# Patient Record
Sex: Female | Born: 1935 | Race: Black or African American | Hispanic: No | Smoking: Never smoker
Health system: Southern US, Community
[De-identification: ages and names within clinical notes are randomized; demographics above are authoritative.]

## PROBLEM LIST (undated history)

## (undated) DIAGNOSIS — Z87898 Personal history of other specified conditions: Secondary | ICD-10-CM

## (undated) DIAGNOSIS — E785 Hyperlipidemia, unspecified: Secondary | ICD-10-CM

## (undated) DIAGNOSIS — M1990 Unspecified osteoarthritis, unspecified site: Secondary | ICD-10-CM

## (undated) DIAGNOSIS — I10 Essential (primary) hypertension: Secondary | ICD-10-CM

## (undated) HISTORY — DX: Hyperlipidemia, unspecified: E78.5

## (undated) HISTORY — DX: Personal history of other specified conditions: Z87.898

## (undated) HISTORY — DX: Unspecified osteoarthritis, unspecified site: M19.90

## (undated) HISTORY — DX: Essential (primary) hypertension: I10

---

## 1998-06-03 ENCOUNTER — Encounter: Payer: Self-pay | Admitting: Internal Medicine

## 1998-06-03 ENCOUNTER — Ambulatory Visit (HOSPITAL_COMMUNITY): Admission: RE | Admit: 1998-06-03 | Discharge: 1998-06-03 | Payer: Self-pay | Admitting: Internal Medicine

## 1998-07-26 ENCOUNTER — Ambulatory Visit (HOSPITAL_COMMUNITY): Admission: RE | Admit: 1998-07-26 | Discharge: 1998-07-26 | Payer: Self-pay | Admitting: Internal Medicine

## 1998-07-26 ENCOUNTER — Encounter: Payer: Self-pay | Admitting: Internal Medicine

## 2003-12-16 ENCOUNTER — Ambulatory Visit: Payer: Self-pay | Admitting: Nurse Practitioner

## 2004-07-06 ENCOUNTER — Ambulatory Visit: Payer: Self-pay | Admitting: Nurse Practitioner

## 2004-09-11 ENCOUNTER — Ambulatory Visit: Payer: Self-pay | Admitting: Nurse Practitioner

## 2004-09-20 ENCOUNTER — Ambulatory Visit: Payer: Self-pay | Admitting: Nurse Practitioner

## 2005-02-12 ENCOUNTER — Ambulatory Visit: Payer: Self-pay | Admitting: Nurse Practitioner

## 2005-02-13 ENCOUNTER — Ambulatory Visit: Payer: Self-pay | Admitting: Nurse Practitioner

## 2005-09-27 ENCOUNTER — Ambulatory Visit: Payer: Self-pay | Admitting: Nurse Practitioner

## 2005-10-17 ENCOUNTER — Ambulatory Visit: Payer: Self-pay | Admitting: Nurse Practitioner

## 2007-02-27 HISTORY — PX: NM MYOVIEW LTD: HXRAD82

## 2010-04-27 HISTORY — PX: TRANSTHORACIC ECHOCARDIOGRAM: SHX275

## 2010-09-26 ENCOUNTER — Encounter: Payer: Self-pay | Admitting: Podiatrist

## 2012-04-25 ENCOUNTER — Encounter: Payer: Self-pay | Admitting: Cardiology

## 2012-04-25 DIAGNOSIS — E785 Hyperlipidemia, unspecified: Secondary | ICD-10-CM

## 2012-05-28 ENCOUNTER — Ambulatory Visit: Payer: Medicare Other | Attending: Internal Medicine | Admitting: Physical Therapy

## 2012-05-28 DIAGNOSIS — R293 Abnormal posture: Secondary | ICD-10-CM | POA: Insufficient documentation

## 2012-05-28 DIAGNOSIS — M545 Low back pain, unspecified: Secondary | ICD-10-CM | POA: Insufficient documentation

## 2012-05-28 DIAGNOSIS — R5381 Other malaise: Secondary | ICD-10-CM | POA: Insufficient documentation

## 2012-05-28 DIAGNOSIS — IMO0001 Reserved for inherently not codable concepts without codable children: Secondary | ICD-10-CM | POA: Insufficient documentation

## 2012-06-04 ENCOUNTER — Ambulatory Visit: Payer: Medicare Other | Admitting: Rehabilitation

## 2012-06-16 ENCOUNTER — Encounter: Payer: 59 | Admitting: Physical Therapy

## 2012-06-16 ENCOUNTER — Ambulatory Visit: Payer: Medicare Other | Admitting: Physical Therapy

## 2012-06-23 ENCOUNTER — Ambulatory Visit: Payer: Medicare Other | Admitting: Physical Therapy

## 2012-11-06 ENCOUNTER — Encounter: Payer: Self-pay | Admitting: Cardiology

## 2012-11-06 ENCOUNTER — Ambulatory Visit (INDEPENDENT_AMBULATORY_CARE_PROVIDER_SITE_OTHER): Payer: 59 | Admitting: Cardiology

## 2012-11-06 VITALS — BP 122/70 | HR 77 | Ht 61.0 in | Wt 139.0 lb

## 2012-11-06 DIAGNOSIS — E785 Hyperlipidemia, unspecified: Secondary | ICD-10-CM

## 2012-11-06 DIAGNOSIS — Z87898 Personal history of other specified conditions: Secondary | ICD-10-CM

## 2012-11-06 DIAGNOSIS — I10 Essential (primary) hypertension: Secondary | ICD-10-CM

## 2012-11-06 DIAGNOSIS — Z8679 Personal history of other diseases of the circulatory system: Secondary | ICD-10-CM

## 2012-11-06 NOTE — Patient Instructions (Addendum)
Make sure you are only taking one tablet or capsule of Verapamil 240 mg by mouth daily .   Your physician wants you to follow-up in 12 months Dr Gar Gibbon will receive a reminder letter in the mail two months in advance. If you don't receive a letter, please call our office to schedule the follow-up appointment.

## 2012-11-07 ENCOUNTER — Encounter: Payer: Self-pay | Admitting: Cardiology

## 2012-11-19 ENCOUNTER — Encounter: Payer: Self-pay | Admitting: Cardiology

## 2012-11-19 DIAGNOSIS — E785 Hyperlipidemia, unspecified: Secondary | ICD-10-CM | POA: Insufficient documentation

## 2012-11-19 DIAGNOSIS — I10 Essential (primary) hypertension: Secondary | ICD-10-CM | POA: Insufficient documentation

## 2012-11-19 DIAGNOSIS — Z87898 Personal history of other specified conditions: Secondary | ICD-10-CM | POA: Insufficient documentation

## 2012-11-19 NOTE — Assessment & Plan Note (Signed)
DC May relatively well controlled on verapamil. She denies any real notable palpitations.

## 2012-11-19 NOTE — Assessment & Plan Note (Signed)
Well-controlled on verapamil and lisinopril/HCTZ. There was some question of whether or not she was taking too verapamil and a day at one time and she got very dizzy. We ensured that she's only taking one tablet a day. The addition of the HCTZ helps alleviates some of her edema.

## 2012-11-19 NOTE — Progress Notes (Signed)
PCP: No PCP Per Patient  Clinic Note: Chief Complaint  Patient presents with  . Annual Exam    no chest pain, no sob , edema swelling every once in while  uses a rolling walker   HPI: Kelly Ramirez is a 79 y.o. female with a PMH below who presents today for followup of her hypertension and dyslipidemia as well as distant history palpitations. He was a former patient of Dr. Lavonne Chick followed by Dr. Lynnea Ferrier. I last saw her in September of last year.  Interval History: As in the usual case for her she continued to be stable for cardiac standpoint. He says he doesn't walk much outside, because she doesn't patient is in a safe neighborhood. She lives alone and is scared to go outside due to the unstable rate neighbors. Because of that she feels somewhat lazy. She hasn't been as active as he used to be. Her back is hurting her a bit more. She also really hasn't had much appetite and felt that she lost any weight since her last visit. She looks forward to her visits from the home health group that has a nurse a come stay with her for 3 hours a day with meals. She has her stable mild amount of edema but otherwise no PND orthopnea.  The remainder of Cardiovascular ROS: no chest pain or dyspnea on exertion negative for - irregular heartbeat, loss of consciousness, murmur, palpitations, rapid heart rate or shortness of breath is as follows: Additional cardiac review of systems: Lightheadedness - no, dizziness - no, syncope/near-syncope - no; TIA/amaurosis fugax - no Melena - no, hematochezia no; hematuria - no; nosebleeds - no; claudication - no  Past Medical History  Diagnosis Date  . Arthritis   . High blood pressure   . Dyslipidemia   . History of palpitations     Prior Cardiac Evaluation and Past Surgical History: Past Surgical History  Procedure Laterality Date  . Nm myoview ltd  2009    No ischemia or infarction  . Transthoracic echocardiogram  March 2012    Mild concentric LVH,  normal EF, aortic sclerosis, no stenosis. RV pressure 40-50 mmHg moderate TR. Moderate LE dilation. Normal diastolic function for age.    No Known Allergies  Current Outpatient Prescriptions  Medication Sig Dispense Refill  . gabapentin (NEURONTIN) 100 MG capsule Take 100 mg by mouth daily.      Marland Kitchen ibuprofen (ADVIL,MOTRIN) 100 MG tablet Take 200 mg by mouth every 6 (six) hours as needed for fever.      Marland Kitchen lisinopril-hydrochlorothiazide (PRINZIDE,ZESTORETIC) 20-25 MG per tablet Take 20-25 tablets by mouth 1 day or 1 dose.      . Multiple Vitamin (MULTIVITAMIN) tablet Take 1 tablet by mouth daily.      . Omeprazole (PRILOSEC PO) Take 20 mg by mouth daily.       . Oxybutynin Chloride (DITROPAN PO) Take 5 mg by mouth daily.       . polyethylene glycol (MIRALAX / GLYCOLAX) packet       . SIMVASTATIN PO Take 20 mg by mouth daily.       . traMADol (ULTRAM) 50 MG tablet Take 50 mg by mouth every 8 (eight) hours as needed for pain.      Marland Kitchen VERAPAMIL HCL PO Take 240 mg by mouth daily.        No current facility-administered medications for this visit.    History   Social History Narrative   She is a  widow, and she does not mention having a children comment at least and he did live near her. She lives alone in a somewhat less than favorable neighborhood. She scared to go outside. She does not smoke and does not drink. She likes to do things around the house, does not get routine exercise.    ROS: A comprehensive Review of Systems - Negative except The general sense of malaise and poor appetite and back pain.  PHYSICAL EXAM BP 122/70  Pulse 77  Ht 5\' 1"  (1.549 m)  Wt 139 lb (63.05 kg)  BMI 26.28 kg/m2 General appearance: alert, cooperative, appears stated age, no distress and Well-nourished and well-groomed. She doesn't have right upper lip facial twitch and as noted before. Answers questions appropriately. Neck: no adenopathy, no carotid bruit, no JVD, supple, symmetrical, trachea midline and  thyroid not enlarged, symmetric, no tenderness/mass/nodules Lungs: clear to auscultation bilaterally, normal percussion bilaterally and Nonlabored with good air movement Heart: normal apical impulse, regular rate and rhythm, S1: normal, S2: physiologically split, no S3 or S4 and No rubs or murmurs Abdomen: soft, non-tender; bowel sounds normal; no masses,  no organomegaly Extremities: extremities normal, atraumatic, no cyanosis or edema, no edema, redness or tenderness in the calves or thighs and no ulcers, gangrene or trophic changes Pulses: 2+ and symmetric Neurologic: Grossly normal  [id number] today: Yes Rate: 77 , Rhythm: Sagittal PACs (computer read as atrial fibrillation is incorrect); poor anterior R. progression would suggest possibility of anterior MI age undetermined.  Recent Labs: Cholesterol labs checked by PCP.  ASSESSMENT / PLAN: High blood pressure Well-controlled on verapamil and lisinopril/HCTZ. There was some question of whether or not she was taking too verapamil and a day at one time and she got very dizzy. We ensured that she's only taking one tablet a day. The addition of the HCTZ helps alleviates some of her edema.  Dyslipidemia On simvastatin. This is being followed by PCP. Has been stable in the past.  History of palpitations DC May relatively well controlled on verapamil. She denies any real notable palpitations.   Orders Placed This Encounter  Procedures  . EKG 12-Lead   Followup: One year  DAVID W. Herbie Baltimore, M.D., M.S. [facility] [address] [place], [place]  [address]  [phone] Pager # [phone]

## 2012-11-19 NOTE — Assessment & Plan Note (Signed)
On simvastatin. This is being followed by PCP. Has been stable in the past.

## 2012-12-17 ENCOUNTER — Encounter: Payer: Self-pay | Admitting: Podiatrist

## 2012-12-17 ENCOUNTER — Ambulatory Visit (INDEPENDENT_AMBULATORY_CARE_PROVIDER_SITE_OTHER): Payer: Medicare Other | Admitting: Podiatrist

## 2012-12-17 VITALS — BP 117/66 | HR 85 | Resp 16

## 2012-12-17 DIAGNOSIS — M79609 Pain in unspecified limb: Secondary | ICD-10-CM

## 2012-12-17 DIAGNOSIS — B351 Tinea unguium: Secondary | ICD-10-CM

## 2012-12-17 NOTE — Patient Instructions (Signed)

## 2012-12-23 NOTE — Progress Notes (Signed)
HPI:  Patient presents today for follow up of foot and nail care. Denies any new complaints today.  Objective:  Patients chart is reviewed.  Neurovascular status unchanged.  Patients nails are thickened, discolored, distrophic, friable and brittle with yellow-brown discoloration. Patient subjectively relates they are painful with shoes and with ambulation.both feet  Assessment:  Symptomatic onychomycosis  Plan:  Discussed treatment options and alternatives.  The symptomatic toenails were debrided through manual an mechanical means without complication.  Return appointment recommended at routine intervals of 3 months   

## 2013-03-16 ENCOUNTER — Ambulatory Visit (INDEPENDENT_AMBULATORY_CARE_PROVIDER_SITE_OTHER): Payer: Medicare Other | Admitting: Podiatry

## 2013-03-16 ENCOUNTER — Encounter: Payer: Self-pay | Admitting: Podiatry

## 2013-03-16 VITALS — BP 152/98 | HR 98 | Resp 12

## 2013-03-16 DIAGNOSIS — B351 Tinea unguium: Secondary | ICD-10-CM

## 2013-03-16 DIAGNOSIS — M79609 Pain in unspecified limb: Secondary | ICD-10-CM

## 2013-03-16 NOTE — Progress Notes (Signed)
Patient ID: Kelly RushMalinda J Ramirez, female   DOB: 1920/04/18, 80 y.o.   MRN: [id number] HPI: Patient presents today for follow up of foot and nail care. Denies any new complaints today. Her caregiver is present today. Objective: Patients chart is reviewed. Neurovascular status unchanged. Patients nails are thickened, discolored, distrophic, friable and brittle with yellow-brown discoloration. Patient subjectively relates they are painful with shoes and with ambulation.both feet  Assessment: Symptomatic onychomycosis  Plan: Discussed treatment options and alternatives. The symptomatic toenails were debrided through manual an mechanical means without complication. Return appointment recommended at routine intervals of 4 months

## 2013-03-18 ENCOUNTER — Ambulatory Visit: Payer: Medicare Other | Admitting: Podiatrist

## 2013-07-21 ENCOUNTER — Encounter: Payer: Self-pay | Admitting: Podiatry

## 2013-07-21 ENCOUNTER — Ambulatory Visit (INDEPENDENT_AMBULATORY_CARE_PROVIDER_SITE_OTHER): Payer: Medicare Other | Admitting: Podiatry

## 2013-07-21 VITALS — BP 151/90 | HR 93 | Resp 16

## 2013-07-21 DIAGNOSIS — M79609 Pain in unspecified limb: Secondary | ICD-10-CM

## 2013-07-21 DIAGNOSIS — B351 Tinea unguium: Secondary | ICD-10-CM

## 2013-07-21 NOTE — Progress Notes (Signed)
She presents today with a chief complaint of painful E. elongated incurvated nails 1 through 5 bilateral.  Objective: Pulses are palpable bilateral. Her nails are thick yellow dystrophic with mycotic painful palpation and sharply incurvated with pain.  Assessment: Ingrown nails paronychia and pain secondary to mycotic nails 1 through 5 bilateral.  Plan: Debridement of nails 1 through 5 bilateral is cover service to pain.

## 2013-11-17 ENCOUNTER — Encounter: Payer: Self-pay | Admitting: Podiatry

## 2013-11-17 ENCOUNTER — Ambulatory Visit (INDEPENDENT_AMBULATORY_CARE_PROVIDER_SITE_OTHER): Payer: Medicare Other | Admitting: Podiatry

## 2013-11-17 DIAGNOSIS — M79609 Pain in unspecified limb: Secondary | ICD-10-CM

## 2013-11-17 DIAGNOSIS — M79673 Pain in unspecified foot: Secondary | ICD-10-CM

## 2013-11-17 DIAGNOSIS — B351 Tinea unguium: Secondary | ICD-10-CM

## 2013-11-17 NOTE — Progress Notes (Signed)
   Subjective:    Patient ID: Kelly Ramirez, female    DOB: 02-07-1921, 80 y.o.   MRN: [id number]  HPI Comments: Presents today chief complaint of painful elongated toenails.  Objective: Pulses are palpable bilateral nails are thick, yellow dystrophic onychomycosis and painful palpation.   Assessment: Onychomycosis with pain in limb.  Plan: Treatment of nails in thickness and length as covered service secondary to pain.      Review of Systems     Objective:   Physical Exam        Assessment & Plan:

## 2014-03-11 ENCOUNTER — Ambulatory Visit: Payer: Medicaid Other

## 2014-03-16 ENCOUNTER — Encounter: Payer: Self-pay | Admitting: Podiatrist

## 2014-03-16 ENCOUNTER — Ambulatory Visit (INDEPENDENT_AMBULATORY_CARE_PROVIDER_SITE_OTHER): Payer: Medicare Other | Admitting: Podiatrist

## 2014-03-16 DIAGNOSIS — M79673 Pain in unspecified foot: Secondary | ICD-10-CM

## 2014-03-16 DIAGNOSIS — B351 Tinea unguium: Secondary | ICD-10-CM

## 2014-03-16 NOTE — Progress Notes (Signed)
HPI:  Patient presents today for follow up of foot and nail care. Denies any new complaints today.  Objective:  Patients chart is reviewed.  Neurovascular status unchanged.  Patients nails are thickened, discolored, distrophic, friable and brittle with yellow-brown discoloration. Patient subjectively relates they are painful with shoes and with ambulation.both feet  Assessment:  Symptomatic onychomycosis  Plan:  Discussed treatment options and alternatives.  The symptomatic toenails were debrided through manual an mechanical means without complication.  Return appointment recommended at routine intervals of 3 months   

## 2014-03-23 ENCOUNTER — Ambulatory Visit: Payer: Medicare Other | Admitting: Podiatry

## 2014-09-03 ENCOUNTER — Ambulatory Visit (INDEPENDENT_AMBULATORY_CARE_PROVIDER_SITE_OTHER): Payer: Medicare Other | Admitting: Podiatry

## 2014-09-03 ENCOUNTER — Encounter: Payer: Self-pay | Admitting: Podiatry

## 2014-09-03 VITALS — BP 124/68 | HR 88 | Resp 15

## 2014-09-03 DIAGNOSIS — B351 Tinea unguium: Secondary | ICD-10-CM

## 2014-09-03 DIAGNOSIS — M79606 Pain in leg, unspecified: Secondary | ICD-10-CM | POA: Diagnosis not present

## 2014-09-04 NOTE — Progress Notes (Signed)
Subjective:     Patient ID: Kelly Ramirez, female   DOB: [birth year], 81 y.o.   MRN: [id number]  HPI patient presents with elongated nailbeds 1-5 both feet that are thick yellow brittle and she cannot cut   Review of Systems     Objective:   Physical Exam Neurovascular status is diminished but unchanged from previous visit with thick yellow brittle nailbeds 1-5 both feet that are incurvated in the corners and painful    Assessment:     Mycotic nail infection with pain bilateral    Plan:     Reviewed condition and debrided nailbeds and explained continued routine care reappoint 3 months

## 2014-12-09 ENCOUNTER — Ambulatory Visit: Payer: Medicare Other | Admitting: Podiatry

## 2014-12-24 ENCOUNTER — Ambulatory Visit (INDEPENDENT_AMBULATORY_CARE_PROVIDER_SITE_OTHER): Payer: Medicare Other | Admitting: Podiatry

## 2014-12-24 ENCOUNTER — Ambulatory Visit: Payer: Medicare Other | Admitting: Podiatry

## 2014-12-24 DIAGNOSIS — M79606 Pain in leg, unspecified: Secondary | ICD-10-CM

## 2014-12-24 DIAGNOSIS — B351 Tinea unguium: Secondary | ICD-10-CM | POA: Diagnosis not present

## 2014-12-27 NOTE — Progress Notes (Signed)
Patient ID: Kelly RushMalinda J Ace, female   DOB: January 24, 1921, [age] y.o.   MRN: [id number]  Subjective: 81 y.o. returns the office today for painful, elongated, thickened toenails which she is unable to trim herself. Denies any redness or drainage around the nails. Denies any acute changes since last appointment and no new complaints today. Denies any systemic complaints such as fevers, chills, nausea, vomiting.   Objective: AAO 3, NAD DP/PT pulses palpable 2/4, CRT less than 3 seconds Nails hypertrophic, dystrophic, elongated, brittle, discolored 10. There is tenderness overlying the nails 1-5 bilaterally. There is no surrounding erythema or drainage along the nail sites. No open lesions or pre-ulcerative lesions are identified. No other areas of tenderness bilateral lower extremities. No overlying edema, erythema, increased warmth. No pain with calf compression, swelling, warmth, erythema.  Assessment: Patient presents with symptomatic onychomycosis  Plan: -Treatment options including alternatives, risks, complications were discussed -Nails sharply debrided 10 without complication/bleeding. -Discussed daily foot inspection. If there are any changes, to call the office immediately.  -Follow-up in 3 months or sooner if any problems are to arise. In the meantime, encouraged to call the office with any questions, concerns, changes symptoms.  Ovid CurdMatthew Wagoner, DPM

## 2015-01-03 ENCOUNTER — Ambulatory Visit: Payer: Medicare Other | Admitting: Podiatry

## 2015-04-12 ENCOUNTER — Ambulatory Visit: Payer: Medicare Other | Admitting: Podiatry

## 2015-04-20 ENCOUNTER — Encounter: Payer: Self-pay | Admitting: Podiatry

## 2015-04-20 ENCOUNTER — Ambulatory Visit (INDEPENDENT_AMBULATORY_CARE_PROVIDER_SITE_OTHER): Payer: Medicare Other | Admitting: Podiatry

## 2015-04-20 DIAGNOSIS — M79675 Pain in left toe(s): Secondary | ICD-10-CM | POA: Diagnosis not present

## 2015-04-20 DIAGNOSIS — B351 Tinea unguium: Secondary | ICD-10-CM | POA: Diagnosis not present

## 2015-04-20 DIAGNOSIS — M79674 Pain in right toe(s): Secondary | ICD-10-CM | POA: Diagnosis not present

## 2015-04-20 NOTE — Progress Notes (Signed)
Patient ID: Kelly Ramirez, female   DOB: Dec 14, 1920, 82 y.o.   MRN: [id number]  Subjective: This patient presents for a scheduled visit complaining of thickened and elongated toenails which are cough walking wearing shoes and requests toenail debridement. Patient was last seen on 12/24/2014 by Dr. Loreta Ave  Objective: Orientated 3 Open skin lesions bilaterally The toenails are elongated, discolored, incurvated, hypertrophic and tender to direct palpation 6-10  Assessment: Symptomatic onychomycoses 6-10  Plan: Debridement toenails 6-10 mechanically and electronically without any bleeding  Reappoint 3 months

## 2015-04-25 ENCOUNTER — Ambulatory Visit: Payer: Medicare Other | Admitting: Sports Medicine

## 2015-07-20 ENCOUNTER — Ambulatory Visit: Payer: Medicare Other | Admitting: Podiatry

## 2015-08-02 ENCOUNTER — Encounter: Payer: Self-pay | Admitting: Podiatry

## 2015-08-02 ENCOUNTER — Ambulatory Visit (INDEPENDENT_AMBULATORY_CARE_PROVIDER_SITE_OTHER): Payer: Medicare Other | Admitting: Podiatry

## 2015-08-02 DIAGNOSIS — M79675 Pain in left toe(s): Secondary | ICD-10-CM | POA: Diagnosis not present

## 2015-08-02 DIAGNOSIS — M79674 Pain in right toe(s): Secondary | ICD-10-CM

## 2015-08-02 DIAGNOSIS — B351 Tinea unguium: Secondary | ICD-10-CM | POA: Diagnosis not present

## 2015-08-02 NOTE — Progress Notes (Signed)
Patient ID: Kelly Ramirez, female   DOB: [birth year], 82 y.o.   MRN: [id number]  Subjective: This patient presents for a scheduled visit complaining of thickened and elongated toenails which are uncomfortable walking wearing shoes and request toenail debridement. Patient's caregiver present in treatment room today  Objective: DP and PT pulses 1/4 bilaterally Capillary reflex immediate bilaterally HAV bilaterally Hammertoe second right The toenails are elongated, brittle, deformed and tender to direct palpation 6-10  Assessment: Symptomatic onychomycoses 6-10  Plan: Debridement toenails 6-10 mechanically and electrically without any bleeding  Reappoint 3 months

## 2015-08-16 ENCOUNTER — Ambulatory Visit: Payer: Medicare Other | Admitting: Podiatry

## 2015-11-09 ENCOUNTER — Encounter: Payer: Self-pay | Admitting: Podiatry

## 2015-11-09 ENCOUNTER — Ambulatory Visit (INDEPENDENT_AMBULATORY_CARE_PROVIDER_SITE_OTHER): Payer: Medicare Other | Admitting: Podiatry

## 2015-11-09 VITALS — BP 114/82 | HR 89 | Resp 14

## 2015-11-09 DIAGNOSIS — B351 Tinea unguium: Secondary | ICD-10-CM | POA: Diagnosis not present

## 2015-11-09 DIAGNOSIS — M79674 Pain in right toe(s): Secondary | ICD-10-CM | POA: Diagnosis not present

## 2015-11-09 DIAGNOSIS — M79675 Pain in left toe(s): Secondary | ICD-10-CM

## 2015-11-10 NOTE — Progress Notes (Signed)
Patient ID: Kelly RushMalinda J Ramirez, female   DOB: 1920-05-10, 82 y.o.   MRN: [id number]    Subjective: This patient presents for a scheduled visit complaining of thickened and elongated toenails which are uncomfortable walking wearing shoes and request toenail debridement. Patient's caregiver present in treatment room today  Objective: DP and PT pulses 1/4 bilaterally Capillary reflex immediate bilaterally HAV bilaterally Hammertoe second right The toenails are elongated, brittle, deformed and tender to direct palpation 6-10  Assessment: Symptomatic onychomycoses 6-10  Plan: Debridement toenails 6-10 mechanically and electrically without any bleeding  Reappoint 3 months

## 2016-02-08 ENCOUNTER — Ambulatory Visit: Payer: Medicare Other | Admitting: Podiatry

## 2016-02-22 ENCOUNTER — Ambulatory Visit (INDEPENDENT_AMBULATORY_CARE_PROVIDER_SITE_OTHER): Payer: Medicare Other | Admitting: Podiatry

## 2016-02-22 NOTE — Progress Notes (Signed)
Erroneous encounter no show  

## 2016-04-18 ENCOUNTER — Encounter: Payer: Self-pay | Admitting: Podiatry

## 2016-04-18 ENCOUNTER — Ambulatory Visit (INDEPENDENT_AMBULATORY_CARE_PROVIDER_SITE_OTHER): Payer: Medicare Other | Admitting: Podiatry

## 2016-04-18 VITALS — BP 97/62 | HR 93 | Resp 18

## 2016-04-18 DIAGNOSIS — M79675 Pain in left toe(s): Secondary | ICD-10-CM | POA: Diagnosis not present

## 2016-04-18 DIAGNOSIS — B351 Tinea unguium: Secondary | ICD-10-CM

## 2016-04-18 DIAGNOSIS — M79674 Pain in right toe(s): Secondary | ICD-10-CM

## 2016-04-18 NOTE — Progress Notes (Signed)
Patient ID: Kelly Ramirez, female   DOB: [birth year], 83 y.o.   MRN: [id number]    Subjective: This patient presents for a scheduled visit complaining of thickened and elongated toenails which are uncomfortable walking wearing shoes and request toenail debridement. Patient's caregiver present in treatment room today  Objective: Orientated 3 DP and PT pulses 1/4 bilaterally Capillary reflex immediate bilaterally Sensation to 10 g monofilament wire intact 3/5 bilaterally Vibratory sensation reactive bilaterally Ankle reflexes reactive bilaterally HAV bilaterally Hammertoe second right Patient walks slowly with assistance of roller walker No open skin lesions bilaterally The toenails are elongated, brittle, deformed and tender to direct palpation 6-10  Assessment: Symptomatic onychomycoses 6-10  Plan: Debridement toenails 6-10 mechanically and electrically without any bleeding  Reappoint at patient's request

## 2016-09-10 ENCOUNTER — Ambulatory Visit: Payer: Medicare Other | Admitting: Podiatry

## 2016-09-24 ENCOUNTER — Encounter: Payer: Self-pay | Admitting: Podiatry

## 2016-09-24 ENCOUNTER — Ambulatory Visit (INDEPENDENT_AMBULATORY_CARE_PROVIDER_SITE_OTHER): Payer: Medicare Other | Admitting: Podiatry

## 2016-09-24 DIAGNOSIS — M79676 Pain in unspecified toe(s): Secondary | ICD-10-CM

## 2016-09-24 DIAGNOSIS — B351 Tinea unguium: Secondary | ICD-10-CM

## 2016-09-24 NOTE — Progress Notes (Signed)
   SUBJECTIVE Patient  presents to office today complaining of elongated, thickened nails. Pain while ambulating in shoes. Patient is unable to trim their own nails.   OBJECTIVE General Patient is awake, alert, and oriented x 3 and in no acute distress. Derm Skin is dry and supple bilateral. Negative open lesions or macerations. Remaining integument unremarkable. Nails are tender, long, thickened and dystrophic with subungual debris, consistent with onychomycosis, 1-5 bilateral. No signs of infection noted. Vasc  DP and PT pedal pulses palpable bilaterally. Temperature gradient within normal limits.  Neuro Epicritic and protective threshold sensation diminished bilaterally.  Musculoskeletal Exam No symptomatic pedal deformities noted bilateral. Muscular strength within normal limits.  ASSESSMENT 1. Onychodystrophic nails 1-5 bilateral with hyperkeratosis of nails.  2. Onychomycosis of nail due to dermatophyte bilateral 3. Pain in foot bilateral  PLAN OF CARE 1. Patient evaluated today.  2. Instructed to maintain good pedal hygiene and foot care.  3. Mechanical debridement of nails 1-5 bilaterally performed using a nail nipper. Filed with dremel without incident.  4. Return to clinic in 3 mos.    Tysin Salada M. Willella Harding, DPM [facility]  Dr. Roschelle Calandra M. Ammanda Dobbins, DPM    [address]                                        [place], [place] [address]                Office [phone]  Fax [phone]      

## 2016-12-09 ENCOUNTER — Inpatient Hospital Stay (HOSPITAL_COMMUNITY): Payer: Medicare Other

## 2016-12-09 ENCOUNTER — Emergency Department (HOSPITAL_COMMUNITY): Payer: Medicare Other

## 2016-12-09 ENCOUNTER — Encounter (HOSPITAL_COMMUNITY)
Admission: EM | Disposition: A | Discharge status: Skilled Nursing Facility | Payer: Self-pay | Source: Home / Self Care | Attending: Internal Medicine

## 2016-12-09 ENCOUNTER — Inpatient Hospital Stay (HOSPITAL_COMMUNITY)
Admission: EM | Admit: 2016-12-09 | Discharge: 2016-12-14 | DRG: 392 | Disposition: A | Discharge status: Skilled Nursing Facility | Payer: Medicare Other | Attending: Internal Medicine | Admitting: Internal Medicine

## 2016-12-09 ENCOUNTER — Encounter (HOSPITAL_COMMUNITY): Payer: Self-pay | Admitting: Emergency Medicine

## 2016-12-09 DIAGNOSIS — Z791 Long term (current) use of non-steroidal anti-inflammatories (NSAID): Secondary | ICD-10-CM

## 2016-12-09 DIAGNOSIS — N179 Acute kidney failure, unspecified: Secondary | ICD-10-CM | POA: Diagnosis present

## 2016-12-09 DIAGNOSIS — D649 Anemia, unspecified: Secondary | ICD-10-CM | POA: Diagnosis present

## 2016-12-09 DIAGNOSIS — R32 Unspecified urinary incontinence: Secondary | ICD-10-CM | POA: Diagnosis present

## 2016-12-09 DIAGNOSIS — I340 Nonrheumatic mitral (valve) insufficiency: Secondary | ICD-10-CM | POA: Diagnosis not present

## 2016-12-09 DIAGNOSIS — D62 Acute posthemorrhagic anemia: Secondary | ICD-10-CM | POA: Diagnosis present

## 2016-12-09 DIAGNOSIS — Y92009 Unspecified place in unspecified non-institutional (private) residence as the place of occurrence of the external cause: Secondary | ICD-10-CM

## 2016-12-09 DIAGNOSIS — K449 Diaphragmatic hernia without obstruction or gangrene: Secondary | ICD-10-CM

## 2016-12-09 DIAGNOSIS — K209 Esophagitis, unspecified without bleeding: Secondary | ICD-10-CM

## 2016-12-09 DIAGNOSIS — R0602 Shortness of breath: Secondary | ICD-10-CM

## 2016-12-09 DIAGNOSIS — I10 Essential (primary) hypertension: Secondary | ICD-10-CM | POA: Diagnosis present

## 2016-12-09 DIAGNOSIS — E876 Hypokalemia: Secondary | ICD-10-CM | POA: Diagnosis present

## 2016-12-09 DIAGNOSIS — K210 Gastro-esophageal reflux disease with esophagitis: Principal | ICD-10-CM | POA: Diagnosis present

## 2016-12-09 DIAGNOSIS — I4891 Unspecified atrial fibrillation: Secondary | ICD-10-CM | POA: Diagnosis present

## 2016-12-09 DIAGNOSIS — M1990 Unspecified osteoarthritis, unspecified site: Secondary | ICD-10-CM | POA: Diagnosis present

## 2016-12-09 DIAGNOSIS — I248 Other forms of acute ischemic heart disease: Secondary | ICD-10-CM | POA: Diagnosis present

## 2016-12-09 DIAGNOSIS — E872 Acidosis: Secondary | ICD-10-CM | POA: Diagnosis present

## 2016-12-09 DIAGNOSIS — R6510 Systemic inflammatory response syndrome (SIRS) of non-infectious origin without acute organ dysfunction: Secondary | ICD-10-CM | POA: Diagnosis present

## 2016-12-09 DIAGNOSIS — F0390 Unspecified dementia without behavioral disturbance: Secondary | ICD-10-CM | POA: Diagnosis present

## 2016-12-09 DIAGNOSIS — E8729 Other acidosis: Secondary | ICD-10-CM | POA: Diagnosis present

## 2016-12-09 DIAGNOSIS — T68XXXA Hypothermia, initial encounter: Secondary | ICD-10-CM | POA: Diagnosis present

## 2016-12-09 DIAGNOSIS — K5730 Diverticulosis of large intestine without perforation or abscess without bleeding: Secondary | ICD-10-CM | POA: Diagnosis present

## 2016-12-09 DIAGNOSIS — G9340 Encephalopathy, unspecified: Secondary | ICD-10-CM | POA: Diagnosis present

## 2016-12-09 DIAGNOSIS — Z8711 Personal history of peptic ulcer disease: Secondary | ICD-10-CM

## 2016-12-09 DIAGNOSIS — K228 Other specified diseases of esophagus: Secondary | ICD-10-CM | POA: Diagnosis present

## 2016-12-09 DIAGNOSIS — R935 Abnormal findings on diagnostic imaging of other abdominal regions, including retroperitoneum: Secondary | ICD-10-CM

## 2016-12-09 DIAGNOSIS — I361 Nonrheumatic tricuspid (valve) insufficiency: Secondary | ICD-10-CM | POA: Diagnosis not present

## 2016-12-09 DIAGNOSIS — K922 Gastrointestinal hemorrhage, unspecified: Secondary | ICD-10-CM | POA: Diagnosis present

## 2016-12-09 DIAGNOSIS — I959 Hypotension, unspecified: Secondary | ICD-10-CM | POA: Diagnosis present

## 2016-12-09 DIAGNOSIS — R739 Hyperglycemia, unspecified: Secondary | ICD-10-CM | POA: Diagnosis present

## 2016-12-09 DIAGNOSIS — K921 Melena: Secondary | ICD-10-CM | POA: Diagnosis present

## 2016-12-09 DIAGNOSIS — E785 Hyperlipidemia, unspecified: Secondary | ICD-10-CM | POA: Diagnosis present

## 2016-12-09 DIAGNOSIS — Z4659 Encounter for fitting and adjustment of other gastrointestinal appliance and device: Secondary | ICD-10-CM

## 2016-12-09 DIAGNOSIS — K8020 Calculus of gallbladder without cholecystitis without obstruction: Secondary | ICD-10-CM | POA: Diagnosis present

## 2016-12-09 DIAGNOSIS — R54 Age-related physical debility: Secondary | ICD-10-CM | POA: Diagnosis present

## 2016-12-09 DIAGNOSIS — E860 Dehydration: Secondary | ICD-10-CM

## 2016-12-09 HISTORY — PX: ESOPHAGOGASTRODUODENOSCOPY: SHX5428

## 2016-12-09 LAB — IRON AND TIBC
Iron: 15 ug/dL — ABNORMAL LOW (ref 28–170)
SATURATION RATIOS: 6 % — AB (ref 10.4–31.8)
TIBC: 260 ug/dL (ref 250–450)
UIBC: 245 ug/dL

## 2016-12-09 LAB — PREPARE RBC (CROSSMATCH)

## 2016-12-09 LAB — CBC WITH DIFFERENTIAL/PLATELET
BASOS ABS: 0 10*3/uL (ref 0.0–0.1)
BASOS PCT: 0 %
Basophils Absolute: 0 10*3/uL (ref 0.0–0.1)
Basophils Relative: 0 %
EOS ABS: 0 10*3/uL (ref 0.0–0.7)
Eosinophils Absolute: 0 10*3/uL (ref 0.0–0.7)
Eosinophils Relative: 0 %
Eosinophils Relative: 0 %
HCT: 29.8 % — ABNORMAL LOW (ref 36.0–46.0)
HEMATOCRIT: 22.3 % — AB (ref 36.0–46.0)
HEMOGLOBIN: 7.2 g/dL — AB (ref 12.0–15.0)
HEMOGLOBIN: 10.2 g/dL — AB (ref 12.0–15.0)
LYMPHS ABS: 1.4 10*3/uL (ref 0.7–4.0)
Lymphocytes Relative: 18 %
Lymphocytes Relative: 12 %
Lymphs Abs: 2.1 10*3/uL (ref 0.7–4.0)
MCH: 30.8 pg (ref 26.0–34.0)
MCH: 31.2 pg (ref 26.0–34.0)
MCHC: 32.3 g/dL (ref 30.0–36.0)
MCHC: 34.2 g/dL (ref 30.0–36.0)
MCV: 95.3 fL (ref 78.0–100.0)
MCV: 91.1 fL (ref 78.0–100.0)
MONO ABS: 0.8 10*3/uL (ref 0.1–1.0)
MONOS PCT: 5 %
MONOS PCT: 7 %
Monocytes Absolute: 0.6 10*3/uL (ref 0.1–1.0)
NEUTROS PCT: 77 %
NEUTROS PCT: 82 %
Neutro Abs: 9.2 10*3/uL — ABNORMAL HIGH (ref 1.7–7.7)
Neutro Abs: 9.9 10*3/uL — ABNORMAL HIGH (ref 1.7–7.7)
Platelets: 234 10*3/uL (ref 150–400)
Platelets: 186 10*3/uL (ref 150–400)
RBC: 2.34 MIL/uL — AB (ref 3.87–5.11)
RBC: 3.27 MIL/uL — ABNORMAL LOW (ref 3.87–5.11)
RDW: 16 % — AB (ref 11.5–15.5)
RDW: 15.6 % — AB (ref 11.5–15.5)
WBC: 11.9 10*3/uL — AB (ref 4.0–10.5)
WBC: 12.1 10*3/uL — ABNORMAL HIGH (ref 4.0–10.5)

## 2016-12-09 LAB — MRSA PCR SCREENING: MRSA BY PCR: NEGATIVE

## 2016-12-09 LAB — COMPREHENSIVE METABOLIC PANEL
ALK PHOS: 36 U/L — AB (ref 38–126)
ALT: 16 U/L (ref 14–54)
ALT: 22 U/L (ref 14–54)
ANION GAP: 21 — AB (ref 5–15)
AST: 44 U/L — ABNORMAL HIGH (ref 15–41)
AST: 44 U/L — AB (ref 15–41)
Albumin: 2.8 g/dL — ABNORMAL LOW (ref 3.5–5.0)
Albumin: 2.8 g/dL — ABNORMAL LOW (ref 3.5–5.0)
Alkaline Phosphatase: 42 U/L (ref 38–126)
Anion gap: 11 (ref 5–15)
BILIRUBIN TOTAL: 0.6 mg/dL (ref 0.3–1.2)
BUN: 57 mg/dL — ABNORMAL HIGH (ref 6–20)
BUN: 52 mg/dL — AB (ref 6–20)
CALCIUM: 7.8 mg/dL — AB (ref 8.9–10.3)
CHLORIDE: 104 mmol/L (ref 101–111)
CO2: 15 mmol/L — ABNORMAL LOW (ref 22–32)
CO2: 22 mmol/L (ref 22–32)
CREATININE: 0.98 mg/dL (ref 0.44–1.00)
Calcium: 8.4 mg/dL — ABNORMAL LOW (ref 8.9–10.3)
Chloride: 101 mmol/L (ref 101–111)
Creatinine, Ser: 1.25 mg/dL — ABNORMAL HIGH (ref 0.44–1.00)
GFR calc Af Amer: 42 mL/min — ABNORMAL LOW (ref >60)
GFR calc Af Amer: 56 mL/min — ABNORMAL LOW (ref >60)
GFR, EST NON AFRICAN AMERICAN: 36 mL/min — AB (ref >60)
GFR, EST NON AFRICAN AMERICAN: 48 mL/min — AB (ref >60)
Glucose, Bld: 182 mg/dL — ABNORMAL HIGH (ref 65–99)
Glucose, Bld: 113 mg/dL — ABNORMAL HIGH (ref 65–99)
POTASSIUM: 3 mmol/L — AB (ref 3.5–5.1)
Potassium: 2.5 mmol/L — CL (ref 3.5–5.1)
SODIUM: 137 mmol/L (ref 135–145)
Sodium: 137 mmol/L (ref 135–145)
TOTAL PROTEIN: 5.3 g/dL — AB (ref 6.5–8.1)
Total Bilirubin: 0.6 mg/dL (ref 0.3–1.2)
Total Protein: 5.3 g/dL — ABNORMAL LOW (ref 6.5–8.1)

## 2016-12-09 LAB — RETICULOCYTES
RBC.: 3.27 MIL/uL — AB (ref 3.87–5.11)
RETIC COUNT ABSOLUTE: 78.5 10*3/uL (ref 19.0–186.0)
RETIC CT PCT: 2.4 % (ref 0.4–3.1)

## 2016-12-09 LAB — TROPONIN I
TROPONIN I: 0.09 ng/mL — AB (ref <0.03)
Troponin I: 0.78 ng/mL (ref <0.03)

## 2016-12-09 LAB — URINALYSIS, ROUTINE W REFLEX MICROSCOPIC
BACTERIA UA: NONE SEEN
BILIRUBIN URINE: NEGATIVE
Glucose, UA: NEGATIVE mg/dL
KETONES UR: NEGATIVE mg/dL
Leukocytes, UA: NEGATIVE
NITRITE: NEGATIVE
PROTEIN: NEGATIVE mg/dL
Specific Gravity, Urine: 1.035 — ABNORMAL HIGH (ref 1.005–1.030)
pH: 5 (ref 5.0–8.0)

## 2016-12-09 LAB — PROTIME-INR
INR: 1.24
INR: 1.26
PROTHROMBIN TIME: 15.7 s — AB (ref 11.4–15.2)
Prothrombin Time: 15.5 seconds — ABNORMAL HIGH (ref 11.4–15.2)

## 2016-12-09 LAB — TSH: TSH: 0.946 u[IU]/mL (ref 0.350–4.500)

## 2016-12-09 LAB — I-STAT CG4 LACTIC ACID, ED
LACTIC ACID, VENOUS: 5.16 mmol/L — AB (ref 0.5–1.9)
Lactic Acid, Venous: 13.97 mmol/L (ref 0.5–1.9)
Lactic Acid, Venous: 1.86 mmol/L (ref 0.5–1.9)

## 2016-12-09 LAB — PROCALCITONIN: PROCALCITONIN: 5.12 ng/mL

## 2016-12-09 LAB — POC OCCULT BLOOD, ED
FECAL OCCULT BLD: POSITIVE — AB
Fecal Occult Bld: NEGATIVE

## 2016-12-09 LAB — ABO/RH: ABO/RH(D): O POS

## 2016-12-09 LAB — LACTIC ACID, PLASMA: Lactic Acid, Venous: 5.2 mmol/L (ref 0.5–1.9)

## 2016-12-09 LAB — LIPASE, BLOOD: Lipase: 17 U/L (ref 11–51)

## 2016-12-09 LAB — VITAMIN B12: Vitamin B-12: 484 pg/mL (ref 180–914)

## 2016-12-09 LAB — FERRITIN: FERRITIN: 58 ng/mL (ref 11–307)

## 2016-12-09 LAB — CK: Total CK: 186 U/L (ref 38–234)

## 2016-12-09 LAB — FOLATE: Folate: 9.7 ng/mL (ref >5.9)

## 2016-12-09 SURGERY — EGD (ESOPHAGOGASTRODUODENOSCOPY)
Anesthesia: Moderate Sedation

## 2016-12-09 MED ORDER — SODIUM CHLORIDE 0.9 % IV BOLUS (SEPSIS)
1000.0000 mL | Freq: Once | INTRAVENOUS | Status: AC
Start: 2016-12-09 — End: 2016-12-09
  Administered 2016-12-09: 1000 mL via INTRAVENOUS

## 2016-12-09 MED ORDER — SODIUM CHLORIDE 0.9 % IV SOLN
80.0000 mg | Freq: Once | INTRAVENOUS | Status: AC
  Administered 2016-12-09: 21:00:00 80 mg via INTRAVENOUS
  Filled 2016-12-09: qty 80

## 2016-12-09 MED ORDER — SODIUM CHLORIDE 0.9 % IV SOLN: INTRAVENOUS | Status: DC

## 2016-12-09 MED ORDER — ONDANSETRON HCL 4 MG/2ML IJ SOLN: 4.0000 mg | Freq: Four times a day (QID) | INTRAMUSCULAR | Status: DC | PRN

## 2016-12-09 MED ORDER — MIDAZOLAM HCL 5 MG/ML IJ SOLN
INTRAMUSCULAR | Status: AC
  Filled 2016-12-09: qty 3

## 2016-12-09 MED ORDER — POTASSIUM CHLORIDE 10 MEQ/100ML IV SOLN
10.0000 meq | INTRAVENOUS | Status: AC
  Administered 2016-12-09 – 2016-12-10 (×3): 10 meq via INTRAVENOUS
  Filled 2016-12-09 (×2): qty 100

## 2016-12-09 MED ORDER — PANTOPRAZOLE SODIUM 40 MG IV SOLR: 40.0000 mg | Freq: Two times a day (BID) | INTRAVENOUS | Status: DC

## 2016-12-09 MED ORDER — MIDAZOLAM HCL 10 MG/2ML IJ SOLN
INTRAMUSCULAR | Status: DC | PRN
  Administered 2016-12-09 (×5): 1 mg via INTRAVENOUS

## 2016-12-09 MED ORDER — SODIUM CHLORIDE 0.9% FLUSH: 3.0000 mL | Freq: Two times a day (BID) | INTRAVENOUS | Status: DC

## 2016-12-09 MED ORDER — SODIUM CHLORIDE 0.9 % IV BOLUS (SEPSIS)
1000.0000 mL | Freq: Once | INTRAVENOUS | Status: AC
  Administered 2016-12-09: 1000 mL via INTRAVENOUS

## 2016-12-09 MED ORDER — SODIUM CHLORIDE 0.9 % IV SOLN
8.0000 mg/h | INTRAVENOUS | Status: DC
  Administered 2016-12-09 – 2016-12-10 (×2): 8 mg/h via INTRAVENOUS
  Filled 2016-12-09 (×3): qty 80

## 2016-12-09 MED ORDER — FENTANYL CITRATE (PF) 100 MCG/2ML IJ SOLN
INTRAMUSCULAR | Status: DC | PRN
  Administered 2016-12-09: 25 ug via INTRAVENOUS

## 2016-12-09 MED ORDER — IOPAMIDOL (ISOVUE-300) INJECTION 61%
INTRAVENOUS | Status: AC
  Administered 2016-12-09: 80 mL
  Filled 2016-12-09: qty 100

## 2016-12-09 MED ORDER — LACTATED RINGERS IV SOLN
INTRAVENOUS | Status: DC
  Administered 2016-12-09 – 2016-12-10 (×2): via INTRAVENOUS

## 2016-12-09 MED ORDER — DIPHENHYDRAMINE HCL 50 MG/ML IJ SOLN
INTRAMUSCULAR | Status: AC
  Filled 2016-12-09: qty 1

## 2016-12-09 MED ORDER — SODIUM CHLORIDE 0.9 % IV SOLN: Freq: Once | INTRAVENOUS | Status: DC

## 2016-12-09 MED ORDER — PIPERACILLIN-TAZOBACTAM 3.375 G IVPB
3.3750 g | Freq: Three times a day (TID) | INTRAVENOUS | Status: DC
  Administered 2016-12-09 – 2016-12-10 (×2): 3.375 g via INTRAVENOUS
  Filled 2016-12-09 (×3): qty 50

## 2016-12-09 MED ORDER — ONDANSETRON HCL 4 MG PO TABS: 4.0000 mg | ORAL_TABLET | Freq: Four times a day (QID) | ORAL | Status: DC | PRN

## 2016-12-09 MED ORDER — PANTOPRAZOLE SODIUM 40 MG IV SOLR
40.0000 mg | Freq: Once | INTRAVENOUS | Status: AC
  Administered 2016-12-09: 40 mg via INTRAVENOUS
  Filled 2016-12-09: qty 40

## 2016-12-09 MED ORDER — ONDANSETRON HCL 4 MG/2ML IJ SOLN
4.0000 mg | Freq: Once | INTRAMUSCULAR | Status: DC
  Filled 2016-12-09: qty 2

## 2016-12-09 MED ORDER — FENTANYL CITRATE (PF) 100 MCG/2ML IJ SOLN
INTRAMUSCULAR | Status: AC
  Filled 2016-12-09: qty 4

## 2016-12-09 MED ORDER — SODIUM CHLORIDE 0.9 % IV SOLN
Freq: Once | INTRAVENOUS | Status: AC
  Administered 2016-12-09: 21:00:00 via INTRAVENOUS

## 2016-12-09 MED ORDER — PIPERACILLIN-TAZOBACTAM 3.375 G IVPB 30 MIN
3.3750 g | Freq: Once | INTRAVENOUS | Status: AC
  Administered 2016-12-09: 3.375 g via INTRAVENOUS
  Filled 2016-12-09: qty 50

## 2016-12-09 NOTE — ED Notes (Signed)
Ultrasound at bedside

## 2016-12-09 NOTE — ED Notes (Signed)
Pt tolerating transfusion without c/o

## 2016-12-09 NOTE — H&P (Signed)
PULMONARY / CRITICAL CARE MEDICINE   Name: Kelly Ramirez MRN: [id number] DOB: 28-Jun-1920    ADMISSION DATE:  12/09/2016 CONSULTATION DATE:  10/14   REFERRING MD:   Particia Nearing   CHIEF COMPLAINT:   UGIB AF w/ RVR & hypotension   HISTORY OF PRESENT ILLNESS:   This is a 83 year old female w/ history of HTN, PUD and arthritis. Per her more recently some falls. Takes BC powders for pain. She lives by herself.  On 10/14 neighbors called EMS because they heard her yelling out. On EMS arrival she was found on a bedside commode covered in dark stool. She confused.  ER course: Tachy af w/ RVR Initial labs: lactic acid 13.97-->cleared to 5.2; hgb 7.2 hct 22.3, PLTs 234, INR 1.24. Ct ABD: mild intra and extrahepatic biliary ductal dilatation but no common duct stone is identified.Extensive sigmoid diverticulosis without evidence diverticulitis. ABD us:Was negative gort murphy's sign.  Was given warmed IVFs and 2 units PRBCs, Mental status improved. Had several large dark colored liquid stools while in ER. Guaiac positive. PCCM asked to evaluate given concern for hemodynamic and clinical decline.    PAST MEDICAL HISTORY :  She  has a past medical history of Arthritis; Dyslipidemia; High blood pressure; and History of palpitations.  PAST SURGICAL HISTORY: She  has a past surgical history that includes NM MYOVIEW LTD (2009) and transthoracic echocardiogram (March 2012).  No Known Allergies  No current facility-administered medications on file prior to encounter.    Current Outpatient Prescriptions on File Prior to Encounter  Medication Sig  . gabapentin (NEURONTIN) 100 MG capsule Take 100 mg by mouth 2 (two) times daily.   Marland Kitchen lisinopril-hydrochlorothiazide (PRINZIDE,ZESTORETIC) 20-25 MG per tablet Take 20-25 tablets by mouth 1 day or 1 dose.  Marland Kitchen Omeprazole (PRILOSEC PO) Take 20 mg by mouth daily.   Marland Kitchen oxybutynin (DITROPAN) 5 MG tablet Take 5 mg by mouth 2 (two) times daily.   . simvastatin (ZOCOR)  20 MG tablet Take 20 mg by mouth daily.   Marland Kitchen ibuprofen (ADVIL,MOTRIN) 100 MG tablet Take 200 mg by mouth every 6 (six) hours as needed for fever.  . lidocaine (LIDODERM) 5 %   . Multiple Vitamin (MULTIVITAMIN) tablet Take 1 tablet by mouth daily.  . polyethylene glycol (MIRALAX / GLYCOLAX) packet   . traMADol (ULTRAM) 50 MG tablet Take 50 mg by mouth every 8 (eight) hours as needed for pain.  . verapamil (CALAN) 120 MG tablet Take 240 mg by mouth daily.   . VOLTAREN 1 % GEL     FAMILY HISTORY:  Her has no family status information on file.    SOCIAL HISTORY: She  reports that she has never smoked. She has never used smokeless tobacco. She reports that she does not drink alcohol or use drugs.  REVIEW OF SYSTEMS:   Unable   SUBJECTIVE:  Denies sig pain   VITAL SIGNS: BP (!) 123/58   Pulse (!) 122   Temp 99.5 F (37.5 C) (Temporal)   Resp (!) 21   Wt 139 lb (63 kg)   SpO2 93%   BMI 26.26 kg/m   HEMODYNAMICS:    VENTILATOR SETTINGS:    INTAKE / OUTPUT: No intake/output data recorded.  PHYSICAL EXAMINATION: General:  Elderly 83 year old aaf, resting in bed. No distress.  Neuro:  Awake, alert, oriented X 1-2. Very hard of hearing. Which may be contributing to neuro eval. Moves all extremities  HEENT:  [place]. No JVD mmm Cardiovascular:  tacy irreg irreg w/ af on tele  Lungs:  Clear to ausc. No accessory use  Abdomen:  Soft, not tender. + bowel sounds. no OM  Musculoskeletal:  Equal st and bulk Skin:  Warm and dry   LABS:  BMET  Recent Labs Lab 12/09/16 0907  NA 137  K 3.0*  CL 101  CO2 15*  BUN 57*  CREATININE 1.25*  GLUCOSE 182*    Electrolytes  Recent Labs Lab 12/09/16 0907  CALCIUM 8.4*    CBC  Recent Labs Lab 12/09/16 0907  WBC 11.9*  HGB 7.2*  HCT 22.3*  PLT 234    Coag's  Recent Labs Lab 12/09/16 0907  INR 1.24    Sepsis Markers  Recent Labs Lab 12/09/16 0939 12/09/16 1216 12/09/16 1225  LATICACIDVEN 13.97* 5.2* 5.16*     ABG No results for input(s): PHART, PCO2ART, PO2ART in the last 168 hours.  Liver Enzymes  Recent Labs Lab 12/09/16 0907  AST 44*  ALT 16  ALKPHOS 42  BILITOT 0.6  ALBUMIN 2.8*    Cardiac Enzymes  Recent Labs Lab 12/09/16 0907  TROPONINI 0.09*    Glucose No results for input(s): GLUCAP in the last 168 hours.  Imaging Ct Abdomen Pelvis W Contrast  Result Date: 12/09/2016 CLINICAL DATA:  Nausea and vomiting with an elevated lactic acid and decreased hemoglobin. Elevated white blood cell count. EXAM: CT ABDOMEN AND PELVIS WITH CONTRAST TECHNIQUE: Multidetector CT imaging of the abdomen and pelvis was performed using the standard protocol following bolus administration of intravenous contrast. CONTRAST:  80 ml ISOVUE-300 IOPAMIDOL (ISOVUE-300) INJECTION 61% COMPARISON:  None. FINDINGS: Lower chest: There is cardiomegaly. No pericardial pleural effusion. Dependent atelectasis is seen in the lung bases. The distal esophagus is dilated and fluid-filled. Hepatobiliary: Multiple stones are identified in the gallbladder. There is pericholecystic fluid. Mild intrahepatic biliary ductal dilatation is seen. The common bile duct is also mildly dilated at 0.8 cm. No focal liver lesion is identified. Pancreas: The pancreas is atrophic. No focal lesion or surrounding inflammatory change. Spleen: Normal in size. Adrenals/Urinary Tract: The adrenal glands appear normal. The patient has extensive cyst formation in the left kidney. A few small low attenuating lesions in the right kidney are likely cysts. Urinary bladder is unremarkable. Stomach/Bowel: The stomach is fluid-filled. Hiatal hernia is noted. No evidence of small-bowel obstruction is identified. Prominent gas and stool in the ascending colon and proximal transverse colon are seen. The patient has extensive sigmoid diverticulosis. The appendix is not visualized but no pericecal inflammatory process is seen. No pneumatosis, portal venous gas  or free intraperitoneal air is seen. Vascular/Lymphatic: Extensive aortoiliac atherosclerosis is seen. No aneurysm or branch vessel occlusion identified. Reproductive: Status post hysterectomy. No adnexal masses. Other: No fluid collection. Musculoskeletal: The patient has severe multilevel thoracic and lumbar spondylosis. Convex left lumbar scoliosis is noted. No acute abnormality or focal lesion. IMPRESSION: No CT signs of bowel ischemia.  Negative for bowel obstruction. Multiple gallstones with gallbladder wall thickening and/or pericholecystic fluid. If there is concern for cholecystitis, right upper quadrant ultrasound could be used for further evaluation. There is mild intra and extrahepatic biliary ductal dilatation but no common duct stone is identified. Extensive sigmoid diverticulosis without evidence diverticulitis. Fluid filled distal esophagus with a hiatal hernia could be due to reflux and/or poor motility. Gallstones with pericholecystic fluid and gallbladder wall thickening and mild dilatation of the common bile duct. Aortoiliac atherosclerosis without aneurysm. Cardiomegaly. Electronically Signed   By: Drusilla Kanner M.D.  On: 12/09/2016 11:53   Dg Chest Port 1 View  Result Date: 12/09/2016 CLINICAL DATA:  Shortness of breath EXAM: PORTABLE CHEST 1 VIEW COMPARISON:  None. FINDINGS: Mild left basilar atelectasis. Right lung is clear. No pleural effusion or pneumothorax. The heart is normal in size. Degenerative changes of the bilateral shoulders. IMPRESSION: No evidence of acute cardiopulmonary disease. Electronically Signed   By: Charline Bills M.D.   On: 12/09/2016 09:03   US Abdomen Limited Ruq  Result Date: 12/09/2016 CLINICAL DATA:  Cholelithiasis on CT of the abdomen EXAM: ULTRASOUND ABDOMEN LIMITED RIGHT UPPER QUADRANT COMPARISON:  None. FINDINGS: Gallbladder: Multiple gallstones within lumen gallbladder. The gallbladder wall is mildly thickened at 4 mm. No pericholecystic  fluid. Negative sonographic Murphy's sign. Common bile duct: Diameter: Normal at 5 mm Liver: No focal lesion identified. Within normal limits in parenchymal echogenicity. Portal vein is patent on color Doppler imaging with normal direction of blood flow towards the liver. IMPRESSION: 1. Multiple gallstones fill the lumen of the gallbladder with mild gallbladder wall thickening. No sonographic Murphy's sign. No pericholecystic fluid. 2. Normal common bile duct . Electronically Signed   By: Genevive Bi M.D.   On: 12/09/2016 15:27     STUDIES:  Ct ABD 10/14: No CT signs of bowel ischemia.  Negative for bowel obstruction. Multiple gallstones with gallbladder wall thickening and/or pericholecystic fluid. If there is concern for cholecystitis, right upper quadrant ultrasound could be used for further evaluation. There is mild intra and extrahepatic biliary ductal dilatation but no common duct stone is identified.Extensive sigmoid diverticulosis without evidence diverticulitis.Fluid filled distal esophagus with a hiatal hernia could be due to reflux and/or poor motility. Gallstones with pericholecystic fluid and gallbladder wall thickening and mild dilatation of the common bile duct. abd [place]: multiple gallstones, mild GB distention, no murphy's sign, nml CBD  CULTURES: bc 10/14>>> Stool culture 10/14>>>  ANTIBIOTICS: Zosyn 10/14>>>  SIGNIFICANT EVENTS:   LINES/TUBES:   DISCUSSION: 83 year old female who apparently was in usual state of health up until10/14 when found incontinent and confused. She apparently was covered with dark colored stool. She's had several loose dark stools since presentation in the ER. Her admitting her with working diagnosis of upper GI bleed given history of NSAIDs and prior peptic ulcer disease. Although she still somewhat encephalopathic she did raise concern about "eating something bad". She's been seen by GI with plans for EGD later today. She's getting blood.  We'll admit her to the intensive care given her high risk for hemodynamic deterioration.  ASSESSMENT / PLAN:  Acute UGIB w/ acute blood loss anemia. Suspect PUD wd/ h/o ulcers. Also takes NSAIDS Seen By GI Now s/p 2 units PRBC Plan Cont current blood transfusion Add PPI gtt Dc NSAIDs For EGD in ENDO  Afib w/ RVR. No h/o af. Suspect d/t her acute illness  Mild trop I elevation  Plan Holding antihypertensives Admit to ICU Tele  Transfuse and cont IVFs No role for ac  Trend trop I  Consider ECHO  Cholelithiasis and diverticular disease both w/out evidence of acute infection such as cholecystitis or diverticulitis.  Plan GI following NPO  SIRS w/ + lactic acidosis. Rule out sepsis, however no clear evidence of this -lactic acid cleared from > 13 to 5.2.  GI infectious translocation or infectious gastroenteritis Plan Send stool PCR and blood cultures Day 1 Zosyn Trend CBC and fever curve  Mild acute kidney injury  Plan Continue IV fluids Holding ACE inhibitor Holding other antihypertensives Follow-up a.m. Chemistry  Acute encephalopathy this is improved with resuscitation efforts. Plan Holding any and all sedating medications Supportive care  Hypokalemia Plan Replace plan recheck  hyperglycemia Plan Serial CBG, may need ssi   FAMILY  - Updates: pending   - Inter-disciplinary family meet or Palliative Care meeting due by:  10/21  My ccm time 40 min   Simonne Martinet ACNP-BC [facility] Pulmonary/Critical Care Pager # [phone] OR # [phone] if no answer   12/09/2016, 4:24 PM

## 2016-12-09 NOTE — Consult Note (Signed)
Cross cover for [employer] GI Reason for Consult: Melenic stools with anemia. Referring Physician: ER MD  Kelly Ramirez is an 83 y.o. female.  HPI: Ms. Kelly Ramirez is a 83 year old black female with a history of hypertension and arthritis hyperlipidemia and a previous history of peptic ulcer disease. Patient seems somewhat confused but there is no family member at bedside to provide any details on her history. Reportedly patient's neighbors heard her screaming and came to her help when she was found to be a limited bedside commode covered with dark stool. She was brought to the emergency room by EMS and was found to be hypotensive and tachycardia with lactic acidosis which seemed to resolve with rehydration. She admits to using [facility] powders on a daily basis and has had peptic ulcers in the remote past she denies having any abdominal pain nausea vomiting. Reportedly she had ever had a colonoscopy. She says she has a stepson who "watches over her".  Past Medical History:  Diagnosis Date  . Arthritis   . Dyslipidemia   . High blood pressure   . History of palpitations    Past Surgical History:  Procedure Laterality Date  . NM MYOVIEW LTD  2009   No ischemia or infarction  . TRANSTHORACIC ECHOCARDIOGRAM  March 2012   Mild concentric LVH, normal EF, aortic sclerosis, no stenosis. RV pressure 40-50 mmHg moderate TR. Moderate LE dilation. Normal diastolic function for age.   No family history on file.  Social History:  reports that she has never smoked. She has never used smokeless tobacco. She reports that she does not drink alcohol or use drugs.  Allergies: No Known Allergies  Medications: I have reviewed the patient's current medications.  Results for orders placed or performed during the hospital encounter of 12/09/16 (from the past 48 hour(s))  Urinalysis, Routine w reflex microscopic     Status: Abnormal   Collection Time: 12/09/16  8:41 AM  Result Value Ref Range   Color, Urine YELLOW  YELLOW   APPearance CLEAR CLEAR   Specific Gravity, Urine 1.035 (H) 1.005 - 1.030   pH 5.0 5.0 - 8.0   Glucose, UA NEGATIVE NEGATIVE mg/dL   Hgb urine dipstick MODERATE (A) NEGATIVE   Bilirubin Urine NEGATIVE NEGATIVE   Ketones, ur NEGATIVE NEGATIVE mg/dL   Protein, ur NEGATIVE NEGATIVE mg/dL   Nitrite NEGATIVE NEGATIVE   Leukocytes, UA NEGATIVE NEGATIVE   RBC / HPF 0-5 0 - 5 RBC/hpf   WBC, UA 0-5 0 - 5 WBC/hpf   Bacteria, UA NONE SEEN NONE SEEN   Squamous Epithelial / LPF 0-5 (A) NONE SEEN   Mucus PRESENT   POC occult blood, ED Provider will collect     Status: None   Collection Time: 12/09/16  8:57 AM  Result Value Ref Range   Fecal Occult Bld NEGATIVE NEGATIVE  Type and screen [place]     Status: None (Preliminary result)   Collection Time: 12/09/16  9:00 AM  Result Value Ref Range   ABO/RH(D) O POS    Antibody Screen NEG    Sample Expiration 12/12/2016    Unit Number [id number]    Blood Component Type RBC LR PHER1    Unit division 00    Status of Unit ALLOCATED    Transfusion Status OK TO TRANSFUSE    Crossmatch Result Compatible    Unit Number [id number]    Blood Component Type RED CELLS,LR    Unit division 00    Status  of Unit ISSUED    Transfusion Status OK TO TRANSFUSE    Crossmatch Result Compatible   ABO/Rh     Status: None   Collection Time: 12/09/16  9:00 AM  Result Value Ref Range   ABO/RH(D) O POS   Comprehensive metabolic panel     Status: Abnormal   Collection Time: 12/09/16  9:07 AM  Result Value Ref Range   Sodium 137 135 - 145 mmol/L   Potassium 3.0 (L) 3.5 - 5.1 mmol/L   Chloride 101 101 - 111 mmol/L   CO2 15 (L) 22 - 32 mmol/L   Glucose, Bld 182 (H) 65 - 99 mg/dL   BUN 57 (H) 6 - 20 mg/dL   Creatinine, Ser 1.25 (H) 0.44 - 1.00 mg/dL   Calcium 8.4 (L) 8.9 - 10.3 mg/dL   Total Protein 5.3 (L) 6.5 - 8.1 g/dL   Albumin 2.8 (L) 3.5 - 5.0 g/dL   AST 44 (H) 15 - 41 U/L   ALT 16 14 - 54 U/L   Alkaline Phosphatase 42 38 -  126 U/L   Total Bilirubin 0.6 0.3 - 1.2 mg/dL   GFR calc non Af Amer 36 (L) >60 mL/min   GFR calc Af Amer 42 (L) >60 mL/min    Comment: (NOTE) The eGFR has been calculated using the CKD EPI equation. This calculation has not been validated in all clinical situations. eGFR's persistently <60 mL/min signify possible Chronic Kidney Disease.    Anion gap 21 (H) 5 - 15  CBC WITH DIFFERENTIAL     Status: Abnormal   Collection Time: 12/09/16  9:07 AM  Result Value Ref Range   WBC 11.9 (H) 4.0 - 10.5 K/uL   RBC 2.34 (L) 3.87 - 5.11 MIL/uL   Hemoglobin 7.2 (L) 12.0 - 15.0 g/dL   HCT 22.3 (L) 36.0 - 46.0 %   MCV 95.3 78.0 - 100.0 fL   MCH 30.8 26.0 - 34.0 pg   MCHC 32.3 30.0 - 36.0 g/dL   RDW 16.0 (H) 11.5 - 15.5 %   Platelets 234 150 - 400 K/uL   Neutrophils Relative % 77 %   Lymphocytes Relative 18 %   Monocytes Relative 5 %   Eosinophils Relative 0 %   Basophils Relative 0 %   Neutro Abs 9.2 (H) 1.7 - 7.7 K/uL   Lymphs Abs 2.1 0.7 - 4.0 K/uL   Monocytes Absolute 0.6 0.1 - 1.0 K/uL   Eosinophils Absolute 0.0 0.0 - 0.7 K/uL   Basophils Absolute 0.0 0.0 - 0.1 K/uL   RBC Morphology POLYCHROMASIA PRESENT     Comment: TARGET CELLS ELLIPTOCYTES   Protime-INR     Status: Abnormal   Collection Time: 12/09/16  9:07 AM  Result Value Ref Range   Prothrombin Time 15.5 (H) 11.4 - 15.2 seconds   INR 1.24   Troponin I     Status: Abnormal   Collection Time: 12/09/16  9:07 AM  Result Value Ref Range   Troponin I 0.09 (HH) <0.03 ng/mL    Comment: CRITICAL RESULT CALLED TO, READ BACK BY AND VERIFIED WITH: K.COBB RN @ 1019 12/09/16 BY C.EDENS   I-Stat CG4 Lactic Acid, ED     Status: Abnormal   Collection Time: 12/09/16  9:39 AM  Result Value Ref Range   Lactic Acid, Venous 13.97 (HH) 0.5 - 1.9 mmol/L  Prepare RBC     Status: None   Collection Time: 12/09/16 10:00 AM  Result Value Ref Range   Order  Confirmation ORDER PROCESSED BY BLOOD BANK   TSH     Status: None   Collection Time:  12/09/16 12:16 PM  Result Value Ref Range   TSH 0.946 0.350 - 4.500 uIU/mL    Comment: Performed by a 3rd Generation assay with a functional sensitivity of <=0.01 uIU/mL.  Lactic acid, plasma     Status: Abnormal   Collection Time: 12/09/16 12:16 PM  Result Value Ref Range   Lactic Acid, Venous 5.2 (HH) 0.5 - 1.9 mmol/L    Comment: CRITICAL RESULT CALLED TO, READ BACK BY AND VERIFIED WITH: K.COBB,RN 1316 10.14.18 SPIKESN    I-Stat CG4 Lactic Acid, ED     Status: Abnormal   Collection Time: 12/09/16 12:25 PM  Result Value Ref Range   Lactic Acid, Venous 5.16 (HH) 0.5 - 1.9 mmol/L   Comment NOTIFIED PHYSICIAN    Ct Abdomen Pelvis W Contrast  Result Date: 12/09/2016 CLINICAL DATA:  Nausea and vomiting with an elevated lactic acid and decreased hemoglobin. Elevated white blood cell count. EXAM: CT ABDOMEN AND PELVIS WITH CONTRAST TECHNIQUE: Multidetector CT imaging of the abdomen and pelvis was performed using the standard protocol following bolus administration of intravenous contrast. CONTRAST:  80 ml ISOVUE-300 IOPAMIDOL (ISOVUE-300) INJECTION 61% COMPARISON:  None. FINDINGS: Lower chest: There is cardiomegaly. No pericardial pleural effusion. Dependent atelectasis is seen in the lung bases. The distal esophagus is dilated and fluid-filled. Hepatobiliary: Multiple stones are identified in the gallbladder. There is pericholecystic fluid. Mild intrahepatic biliary ductal dilatation is seen. The common bile duct is also mildly dilated at 0.8 cm. No focal liver lesion is identified. Pancreas: The pancreas is atrophic. No focal lesion or surrounding inflammatory change. Spleen: Normal in size. Adrenals/Urinary Tract: The adrenal glands appear normal. The patient has extensive cyst formation in the left kidney. A few small low attenuating lesions in the right kidney are likely cysts. Urinary bladder is unremarkable. Stomach/Bowel: The stomach is fluid-filled. Hiatal hernia is noted. No evidence of  small-bowel obstruction is identified. Prominent gas and stool in the ascending colon and proximal transverse colon are seen. The patient has extensive sigmoid diverticulosis. The appendix is not visualized but no pericecal inflammatory process is seen. No pneumatosis, portal venous gas or free intraperitoneal air is seen. Vascular/Lymphatic: Extensive aortoiliac atherosclerosis is seen. No aneurysm or branch vessel occlusion identified. Reproductive: Status post hysterectomy. No adnexal masses. Other: No fluid collection. Musculoskeletal: The patient has severe multilevel thoracic and lumbar spondylosis. Convex left lumbar scoliosis is noted. No acute abnormality or focal lesion. IMPRESSION: No CT signs of bowel ischemia.  Negative for bowel obstruction. Multiple gallstones with gallbladder wall thickening and/or pericholecystic fluid. If there is concern for cholecystitis, right upper quadrant ultrasound could be used for further evaluation. There is mild intra and extrahepatic biliary ductal dilatation but no common duct stone is identified. Extensive sigmoid diverticulosis without evidence diverticulitis. Fluid filled distal esophagus with a hiatal hernia could be due to reflux and/or poor motility. Gallstones with pericholecystic fluid and gallbladder wall thickening and mild dilatation of the common bile duct. Aortoiliac atherosclerosis without aneurysm. Cardiomegaly. Electronically Signed   By: Inge Rise M.D.   On: 12/09/2016 11:53   Dg Chest Port 1 View  Result Date: 12/09/2016 CLINICAL DATA:  Shortness of breath EXAM: PORTABLE CHEST 1 VIEW COMPARISON:  None. FINDINGS: Mild left basilar atelectasis. Right lung is clear. No pleural effusion or pneumothorax. The heart is normal in size. Degenerative changes of the bilateral shoulders. IMPRESSION: No evidence of acute  cardiopulmonary disease. Electronically Signed   By: Julian Hy M.D.   On: 12/09/2016 09:03   US Abdomen Limited  Ruq  Result Date: 12/09/2016 CLINICAL DATA:  Cholelithiasis on CT of the abdomen EXAM: ULTRASOUND ABDOMEN LIMITED RIGHT UPPER QUADRANT COMPARISON:  None. FINDINGS: Gallbladder: Multiple gallstones within lumen gallbladder. The gallbladder wall is mildly thickened at 4 mm. No pericholecystic fluid. Negative sonographic Murphy's sign. Common bile duct: Diameter: Normal at 5 mm Liver: No focal lesion identified. Within normal limits in parenchymal echogenicity. Portal vein is patent on color Doppler imaging with normal direction of blood flow towards the liver. IMPRESSION: 1. Multiple gallstones fill the lumen of the gallbladder with mild gallbladder wall thickening. No sonographic Murphy's sign. No pericholecystic fluid. 2. Normal common bile duct . Electronically Signed   By: Suzy Bouchard M.D.   On: 12/09/2016 15:27   Review of Systems  Constitutional: Positive for malaise/fatigue. Negative for chills, diaphoresis, fever and weight loss.  HENT: Negative.   Eyes: Negative.   Respiratory: Positive for shortness of breath.   Cardiovascular: Positive for palpitations.  Gastrointestinal: Positive for blood in stool and melena. Negative for abdominal pain, constipation, diarrhea, heartburn, nausea and vomiting.  Genitourinary: Negative.   Musculoskeletal: Positive for back pain and joint pain.  Skin: Negative.   Neurological: Negative.   Endo/Heme/Allergies: Negative.    Blood pressure (!) 123/58, pulse (!) 122, temperature 99.5 F (37.5 C), temperature source Temporal, resp. rate (!) 21, weight 63 kg (139 lb), SpO2 93 %. Physical Exam  Constitutional: Vital signs are normal. She appears cachectic. She is cooperative. She has a sickly appearance. She appears distressed.  HENT:  Head: Normocephalic and atraumatic.  Neck: Neck supple.  Cardiovascular: Regular rhythm and normal pulses.   Respiratory: Effort normal and breath sounds normal.  GI: Soft. Normal appearance. There is no  hepatosplenomegaly. There is no tenderness. There is no CVA tenderness.  Skin: Skin is warm and dry.  Psychiatric: Her mood appears anxious.   Assessment/Plan: 1) Melenic stools with anemia-hemoglbin 7.1 gm/dl-EGD today. We will need to rule out recurrent peptic ulcer disease. 2) Hypertension/Hyperlipidemia.  3) Acute kidney injury. 4) Extensive sigmoid diverticulosis on CT scan done today.  5) Cholelithiasis noted on CT scan., .Macee Venables 12/09/2016, 3:48 PM

## 2016-12-09 NOTE — ED Notes (Signed)
Pt c/o being short of breath-- lungs clear- dr Particia Nearing notified

## 2016-12-09 NOTE — Op Note (Signed)
[facility] Patient Name: Kelly Ramirez Procedure Date : 12/09/2016 MRN: [id number] Attending MD: Juanita Craver , MD Date of Birth: 17-Mar-1920 CSN: [id number] Age: 83 Admit Type: Inpatient Procedure:                Diagnostic EGD. Indications:              Acute post hemorrhagic anemia, Melena. Providers:                Juanita Craver, MD, Elna Breslow, RN, Alan Mulder,                            Technician. Referring MD:             Marshell Garfinkel, MD Medicines:                Fentanyl 25 micrograms & Midazolam 5 mg IV. Complications:            No immediate complications. Estimated Blood Loss:     Estimated blood loss: none. Procedure:                Pre-anesthesia assessment: - Prior to the                            procedure, a History and Physical was performed,                            and patient medications and allergies were                            reviewed. The patient's tolerance of previous                            anesthesia was also reviewed. The risks and                            benefits of the procedure and the sedation options                            and risks were discussed with the patient. All                            questions were answered, and informed consent was                            obtained. Prior anticoagulants: The patient has                            taken previous NSAID medication, last dose was day                            of procedure. ASA Grade assessment: II - A patient                            with mild systemic disease. After reviewing the  risks and benefits, the patient was deemed in                            satisfactory condition to undergo the procedure.                            After obtaining informed consent, the endoscope was                            passed under direct vision. Throughout the                            procedure, the patient's blood pressure, pulse, and                             oxygen saturations were monitored continuously. The                            Endoscope was introduced through the mouth, and                            advanced to the second part of duodenum. The EGD                            was performed with moderate difficulty due to                            abnormal anatomy and lot of debris in the stomach .                            The patient tolerated the procedure well. Scope In: Scope Out: Findings:      The lumen of the esophagus was significantly dilated with a fluid filled       esophagus that was suctioned aggressively; Grade C esophagitis was       noted. Marland Kitchen      LA Grade C (one or more mucosal breaks continuous between tops of 2 or       more mucosal folds, less than 75% circumference) esophagitis with       bleeding was found.      A large hiatal hernia was noted on retroflexion; visualization was       difficult as there was a lot of debris in the stomach as well.      The duodenal bulb and the proximal small bowel appeared normal.      No fresh heme was noted. Impression:               - Significantly dilatation noted throughout the                            length of the esophagus.                           - LA Grade C reflux esophagitis.                           -  Large hiatal hernia; large amount of black debris                            in the stomach; no fresh heme noted.                           - Normal duodenal bulb and proximal small bowel.                           - No specimens collected. Moderate Sedation:      MAC used. Recommendation:           - NPO.                           - NT tube to be placed.                           - Continue present medications. Procedure Code(s):        --- Professional ---                           [phone], Esophagogastroduodenoscopy, flexible,                            transoral; diagnostic, including collection of                             specimen(s) by brushing or washing, when performed                            (separate procedure) Diagnosis Code(s):        --- Professional ---                           K92.1, Melena (includes Hematochezia)                           D62, Acute posthemorrhagic anemia                           K21.0, Gastro-esophageal reflux disease with                            esophagitis                           K44.9, Diaphragmatic hernia without obstruction or                            gangrene CPT copyright 2016 [employer]. All rights reserved. The codes documented in this report are preliminary and upon coder review may  be revised to meet current compliance requirements. Juanita Craver, MD Juanita Craver, MD 12/09/2016 8:28:59 PM This report has been signed electronically. Number of Addenda: 0

## 2016-12-09 NOTE — Progress Notes (Signed)
Pharmacy Antibiotic Note  Kelly Ramirez is a 83 y.o. female admitted on 12/09/2016 with intra-abdominal infxn.  Pharmacy has been consulted for zosyn dosing. Pt is afebrile and WBC is mildly elevated at 11.9. SCr is 1.25.   Plan: Zosyn 3.375gm IV Q8H (4 hr inf) F/u renal fxn, C&S, clinical status   Weight: 139 lb (63 kg)  Temp (24hrs), Avg:97.3 F (36.3 C), Min:93 F (33.9 C), Max:99.5 F (37.5 C)   Recent Labs Lab 12/09/16 0907 12/09/16 0939 12/09/16 1216 12/09/16 1225  WBC 11.9*  --   --   --   CREATININE 1.25*  --   --   --   LATICACIDVEN  --  13.97* 5.2* 5.16*    CrCl cannot be calculated (Unknown ideal weight.).    No Known Allergies  Antimicrobials this admission: Zosyn 10/14>>  Dose adjustments this admission: N/A  Microbiology results: Pending   Thank you for allowing pharmacy to be a part of this patient's care.  Shelbee Apgar, Drake Leach 12/09/2016 4:27 PM

## 2016-12-09 NOTE — Progress Notes (Addendum)
Patient initially except to the stepdown unit. Patient's vital signs as well as lactate improving. Upon my evaluation she is alert but somewhat confused although reportedly much improved as compared to presentation. Hypothermia has corrected. She has received 1 unit of packed red blood cells with an additional unit pending with follow-up CBC afterwards. During my evaluation patient noted to have large volume melanotic stool and generalized abdominal pain. She remains tachycardic although systolic blood pressure greater than 90. I had previously requested EDP contact [facility] to ensure patient appropriate for stepdown placement. Given persistent high-volume melena they agree patient more appropriate for the ICU setting. Patient has also been evaluated by GI/Mann who requested patient be placed in the ICU due to level of bleeding.  Junious Silk, ANP

## 2016-12-09 NOTE — ED Notes (Signed)
Pt had large dark tarry stool,

## 2016-12-09 NOTE — Progress Notes (Signed)
Pt arrived to unit and flexiseal placed with Clydie Braun ED RN. Pt had another incont bowel movement. 2nd unit of blood infusing on arrival to unit. Endo arrived at bedside for procedure.
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
## 2016-12-09 NOTE — ED Notes (Signed)
Kelly Ramirez-- "stepson" -- [phone]

## 2016-12-10 ENCOUNTER — Encounter (HOSPITAL_COMMUNITY): Payer: Self-pay | Admitting: *Deleted

## 2016-12-10 DIAGNOSIS — K209 Esophagitis, unspecified: Secondary | ICD-10-CM

## 2016-12-10 DIAGNOSIS — D649 Anemia, unspecified: Secondary | ICD-10-CM

## 2016-12-10 DIAGNOSIS — K921 Melena: Secondary | ICD-10-CM

## 2016-12-10 LAB — CBC
HCT: 27.8 % — ABNORMAL LOW (ref 36.0–46.0)
HCT: 25.9 % — ABNORMAL LOW (ref 36.0–46.0)
HEMATOCRIT: 25.4 % — AB (ref 36.0–46.0)
HEMATOCRIT: 23.2 % — AB (ref 36.0–46.0)
HEMOGLOBIN: 8.7 g/dL — AB (ref 12.0–15.0)
HEMOGLOBIN: 9.6 g/dL — AB (ref 12.0–15.0)
HEMOGLOBIN: 7.9 g/dL — AB (ref 12.0–15.0)
Hemoglobin: 8.7 g/dL — ABNORMAL LOW (ref 12.0–15.0)
MCH: 31 pg (ref 26.0–34.0)
MCH: 31.7 pg (ref 26.0–34.0)
MCH: 30.7 pg (ref 26.0–34.0)
MCH: 31.1 pg (ref 26.0–34.0)
MCHC: 34.3 g/dL (ref 30.0–36.0)
MCHC: 34.5 g/dL (ref 30.0–36.0)
MCHC: 33.6 g/dL (ref 30.0–36.0)
MCHC: 34.1 g/dL (ref 30.0–36.0)
MCV: 90.4 fL (ref 78.0–100.0)
MCV: 91.7 fL (ref 78.0–100.0)
MCV: 91.5 fL (ref 78.0–100.0)
MCV: 91.3 fL (ref 78.0–100.0)
PLATELETS: 155 10*3/uL (ref 150–400)
Platelets: 151 10*3/uL (ref 150–400)
Platelets: 156 10*3/uL (ref 150–400)
Platelets: 159 10*3/uL (ref 150–400)
RBC: 2.81 MIL/uL — AB (ref 3.87–5.11)
RBC: 3.03 MIL/uL — ABNORMAL LOW (ref 3.87–5.11)
RBC: 2.83 MIL/uL — AB (ref 3.87–5.11)
RBC: 2.54 MIL/uL — AB (ref 3.87–5.11)
RDW: 15.7 % — ABNORMAL HIGH (ref 11.5–15.5)
RDW: 16.1 % — AB (ref 11.5–15.5)
RDW: 15.5 % (ref 11.5–15.5)
RDW: 15.5 % (ref 11.5–15.5)
WBC: 9.8 10*3/uL (ref 4.0–10.5)
WBC: 11.9 10*3/uL — ABNORMAL HIGH (ref 4.0–10.5)
WBC: 10.6 10*3/uL — AB (ref 4.0–10.5)
WBC: 9.8 10*3/uL (ref 4.0–10.5)

## 2016-12-10 LAB — BASIC METABOLIC PANEL
ANION GAP: 8 (ref 5–15)
BUN: 35 mg/dL — ABNORMAL HIGH (ref 6–20)
CHLORIDE: 110 mmol/L (ref 101–111)
CO2: 23 mmol/L (ref 22–32)
Calcium: 7.9 mg/dL — ABNORMAL LOW (ref 8.9–10.3)
Creatinine, Ser: 0.98 mg/dL (ref 0.44–1.00)
GFR calc non Af Amer: 48 mL/min — ABNORMAL LOW (ref >60)
GFR, EST AFRICAN AMERICAN: 56 mL/min — AB (ref >60)
Glucose, Bld: 109 mg/dL — ABNORMAL HIGH (ref 65–99)
Potassium: 3.6 mmol/L (ref 3.5–5.1)
Sodium: 141 mmol/L (ref 135–145)

## 2016-12-10 LAB — COMPREHENSIVE METABOLIC PANEL
ALT: 19 U/L (ref 14–54)
AST: 34 U/L (ref 15–41)
Albumin: 2.4 g/dL — ABNORMAL LOW (ref 3.5–5.0)
Alkaline Phosphatase: 30 U/L — ABNORMAL LOW (ref 38–126)
Anion gap: 10 (ref 5–15)
BUN: 44 mg/dL — ABNORMAL HIGH (ref 6–20)
CHLORIDE: 109 mmol/L (ref 101–111)
CO2: 21 mmol/L — ABNORMAL LOW (ref 22–32)
Calcium: 7.7 mg/dL — ABNORMAL LOW (ref 8.9–10.3)
Creatinine, Ser: 0.86 mg/dL (ref 0.44–1.00)
GFR, EST NON AFRICAN AMERICAN: 56 mL/min — AB (ref >60)
Glucose, Bld: 93 mg/dL (ref 65–99)
POTASSIUM: 2.8 mmol/L — AB (ref 3.5–5.1)
SODIUM: 140 mmol/L (ref 135–145)
Total Bilirubin: 0.9 mg/dL (ref 0.3–1.2)
Total Protein: 4.7 g/dL — ABNORMAL LOW (ref 6.5–8.1)

## 2016-12-10 LAB — TROPONIN I: Troponin I: 0.44 ng/mL (ref <0.03)

## 2016-12-10 LAB — GASTROINTESTINAL PANEL BY PCR, STOOL (REPLACES STOOL CULTURE)

## 2016-12-10 LAB — PHOSPHORUS: Phosphorus: 2.2 mg/dL — ABNORMAL LOW (ref 2.5–4.6)

## 2016-12-10 LAB — MAGNESIUM: MAGNESIUM: 1.4 mg/dL — AB (ref 1.7–2.4)

## 2016-12-10 MED ORDER — DILTIAZEM HCL 30 MG PO TABS
30.0000 mg | ORAL_TABLET | Freq: Two times a day (BID) | ORAL | Status: DC
  Administered 2016-12-10 – 2016-12-12 (×5): 30 mg via ORAL
  Filled 2016-12-10 (×5): qty 1

## 2016-12-10 MED ORDER — PANTOPRAZOLE SODIUM 40 MG IV SOLR
40.0000 mg | Freq: Two times a day (BID) | INTRAVENOUS | Status: DC
  Administered 2016-12-10 – 2016-12-12 (×6): 40 mg via INTRAVENOUS
  Filled 2016-12-10 (×6): qty 40

## 2016-12-10 MED ORDER — SODIUM PHOSPHATES 45 MMOLE/15ML IV SOLN
20.0000 mmol | Freq: Once | INTRAVENOUS | Status: AC
  Administered 2016-12-10: 20 mmol via INTRAVENOUS
  Filled 2016-12-10: qty 6.67

## 2016-12-10 MED ORDER — POTASSIUM CHLORIDE 10 MEQ/100ML IV SOLN
INTRAVENOUS | Status: AC
  Administered 2016-12-10: 10 meq
  Filled 2016-12-10: qty 100

## 2016-12-10 MED ORDER — METOPROLOL TARTRATE 5 MG/5ML IV SOLN
2.5000 mg | INTRAVENOUS | Status: DC | PRN
  Administered 2016-12-10 – 2016-12-14 (×3): 2.5 mg via INTRAVENOUS
  Filled 2016-12-10 (×3): qty 5

## 2016-12-10 MED ORDER — POTASSIUM CHLORIDE 10 MEQ/100ML IV SOLN
10.0000 meq | INTRAVENOUS | Status: AC
  Administered 2016-12-10 (×6): 10 meq via INTRAVENOUS
  Filled 2016-12-10 (×6): qty 100

## 2016-12-10 MED ORDER — CHLORHEXIDINE GLUCONATE 0.12 % MT SOLN
15.0000 mL | Freq: Two times a day (BID) | OROMUCOSAL | Status: DC
  Administered 2016-12-10 – 2016-12-14 (×10): 15 mL via OROMUCOSAL
  Filled 2016-12-10 (×8): qty 15

## 2016-12-10 MED ORDER — SODIUM CHLORIDE 0.9 % IV SOLN
INTRAVENOUS | Status: DC
  Administered 2016-12-10: 05:00:00 via INTRAVENOUS

## 2016-12-10 MED ORDER — POTASSIUM CHLORIDE 10 MEQ/100ML IV SOLN
INTRAVENOUS | Status: AC
  Filled 2016-12-10: qty 100

## 2016-12-10 MED ORDER — MAGNESIUM SULFATE 2 GM/50ML IV SOLN
2.0000 g | Freq: Once | INTRAVENOUS | Status: AC
  Administered 2016-12-10: 2 g via INTRAVENOUS
  Filled 2016-12-10: qty 50

## 2016-12-10 MED ORDER — ORAL CARE MOUTH RINSE
15.0000 mL | Freq: Two times a day (BID) | OROMUCOSAL | Status: DC
  Administered 2016-12-10 – 2016-12-14 (×6): 15 mL via OROMUCOSAL

## 2016-12-10 NOTE — Progress Notes (Signed)
Report called at this time to 2W

## 2016-12-10 NOTE — Progress Notes (Signed)
PULMONARY / CRITICAL CARE MEDICINE   Name: RANDY WHITENER MRN: [id number] DOB: 1920-09-18    ADMISSION DATE:  12/09/2016 CONSULTATION DATE:  12/09/2016  CHIEF COMPLAINT:  Upper GI bleed, AF w/ RVR  HISTORY OF PRESENT ILLNESS:   Patient is a 83 year old female who presented after being found down by a caretaker. She has a history significant for HTN, PUD, and arthritis. Uses BC powder for pain, neighbors called EMS as she was heard screaming out. EMS found her covered in dark stool and confused.  ER course: Tachy af w/ RVR Initial labs: lactic acid 13.97-->cleared to 5.2; hgb 7.2 hct 22.3, PLTs 234, INR 1.24. Ct ABD: mild intra and extrahepatic biliary ductal dilatation but no common duct stone is identified.Extensive sigmoid diverticulosis without evidence diverticulitis. ABD [place]: Was negative gort murphy's sign.  Was given warmed IVFs and 2 units PRBCs, Mental status improved. Had several large dark colored liquid stools while in ER. Guaiac positive. PCCM asked to evaluate given concern for hemodynamic and clinical decline.   PAST MEDICAL HISTORY :  She  has a past medical history of Arthritis; Dyslipidemia; High blood pressure; and History of palpitations.  PAST SURGICAL HISTORY: She  has a past surgical history that includes NM MYOVIEW LTD (2009) and transthoracic echocardiogram (March 2012).  No Known Allergies  No current facility-administered medications on file prior to encounter.    Current Outpatient Prescriptions on File Prior to Encounter  Medication Sig  . gabapentin (NEURONTIN) 100 MG capsule Take 100 mg by mouth 2 (two) times daily.   Marland Kitchen lisinopril-hydrochlorothiazide (PRINZIDE,ZESTORETIC) 20-25 MG per tablet Take 20-25 tablets by mouth 1 day or 1 dose.  Marland Kitchen Omeprazole (PRILOSEC PO) Take 20 mg by mouth daily.   Marland Kitchen oxybutynin (DITROPAN) 5 MG tablet Take 5 mg by mouth 2 (two) times daily.   . simvastatin (ZOCOR) 20 MG tablet Take 20 mg by mouth daily.   Marland Kitchen ibuprofen  (ADVIL,MOTRIN) 100 MG tablet Take 200 mg by mouth every 6 (six) hours as needed for fever.  . lidocaine (LIDODERM) 5 %   . Multiple Vitamin (MULTIVITAMIN) tablet Take 1 tablet by mouth daily.  . polyethylene glycol (MIRALAX / GLYCOLAX) packet   . traMADol (ULTRAM) 50 MG tablet Take 50 mg by mouth every 8 (eight) hours as needed for pain.  . verapamil (CALAN) 120 MG tablet Take 240 mg by mouth daily.   . VOLTAREN 1 % GEL     FAMILY HISTORY:  Her has no family status information on file.    SOCIAL HISTORY: She  reports that she has never smoked. She has never used smokeless tobacco. She reports that she does not drink alcohol or use drugs.  REVIEW OF SYSTEMS:   Review of Systems  Constitutional: Positive for chills. Negative for fever.  Eyes: Negative for blurred vision, double vision and pain.  Respiratory: Negative for cough and shortness of breath.   Cardiovascular: Positive for palpitations. Negative for chest pain and leg swelling.  Gastrointestinal: Positive for abdominal pain, diarrhea and melena. Negative for heartburn and vomiting.  Musculoskeletal: Negative for myalgias.  Skin: Negative for itching and rash.  Neurological: Positive for weakness. Negative for dizziness.   SUBJECTIVE:  Patient stated that she would like to be sent home today but was content with staying as long as needed to prevent reoccurrence of the admitting event. She denied pain or specific concerns today. Remained confused  VITAL SIGNS: BP (!) 99/56   Pulse (!) 113   Temp 99.1 F (  37.3 C) (Oral)   Resp (!) 25   Wt 126 lb 1.7 oz (57.2 kg)   SpO2 100%   BMI 23.83 kg/m   HEMODYNAMICS:    VENTILATOR SETTINGS:    INTAKE / OUTPUT: I/O last 3 completed shifts: In: 12819.2 [I.V.:4703.5; Blood:4309; IV Piggyback:3806.7] Out: 325 [Urine:325]  PHYSICAL EXAMINATION: General:  Well appearing, alert, clean Neuro:  Alert and oriented to name and location only HEENT:  Left eye EOM intact and Pupil  is round and reactive to light Cardiovascular:  Tachy, irregular rhythm  Lungs:  Clear to ascultation, no wheezing Abdomen:  Soft, non-tender, bowel sounds auscultated  Musculoskeletal:  Able to move toes on command, no edema observed Skin:  Warm to touch, no ulceration noted on feet  LABS:  BMET  Recent Labs Lab 12/09/16 0907 12/09/16 2111 12/10/16 0413  NA 137 137 140  K 3.0* 2.5* 2.8*  CL 101 104 109  CO2 15* 22 21*  BUN 57* 52* 44*  CREATININE 1.25* 0.98 0.86  GLUCOSE 182* 113* 93    Electrolytes  Recent Labs Lab 12/09/16 0907 12/09/16 2111 12/10/16 0413  CALCIUM 8.4* 7.8* 7.7*  MG  --   --  1.4*  PHOS  --   --  2.2*    CBC  Recent Labs Lab 12/09/16 0907 12/09/16 2111 12/10/16 0413  WBC 11.9* 12.1* 9.8  HGB 7.2* 10.2* 8.7*  HCT 22.3* 29.8* 25.4*  PLT 234 186 151    Coag's  Recent Labs Lab 12/09/16 0907 12/09/16 2111  INR 1.24 1.26    Sepsis Markers  Recent Labs Lab 12/09/16 1216 12/09/16 1225 12/09/16 1805 12/09/16 2111  LATICACIDVEN 5.2* 5.16* 1.86  --   PROCALCITON  --   --   --  5.12    ABG No results for input(s): PHART, PCO2ART, PO2ART in the last 168 hours.  Liver Enzymes  Recent Labs Lab 12/09/16 0907 12/09/16 2111 12/10/16 0413  AST 44* 44* 34  ALT ALKPHOS 42 36* 30*  BILITOT 0.6 0.6 0.9  ALBUMIN 2.8* 2.8* 2.4*    Cardiac Enzymes  Recent Labs Lab 12/09/16 0907 12/09/16 2111 12/10/16 0413  TROPONINI 0.09* 0.78* 0.44*    Glucose No results for input(s): GLUCAP in the last 168 hours.  Imaging Dg Abd 1 View  Result Date: 12/09/2016 CLINICAL DATA:  NG tube placement EXAM: ABDOMEN - 1 VIEW COMPARISON:  None. FINDINGS: NG tube extends into the gastric fundus. LEFT basilar atelectasis. No dilated loops of bowel. IMPRESSION: NG tube in stomach. Electronically Signed   By: Genevive Bi M.D.   On: 12/09/2016 21:10   Ct Abdomen Pelvis W Contrast  Result Date: 12/09/2016 CLINICAL DATA:  Nausea  and vomiting with an elevated lactic acid and decreased hemoglobin. Elevated white blood cell count. EXAM: CT ABDOMEN AND PELVIS WITH CONTRAST TECHNIQUE: Multidetector CT imaging of the abdomen and pelvis was performed using the standard protocol following bolus administration of intravenous contrast. CONTRAST:  80 ml ISOVUE-300 IOPAMIDOL (ISOVUE-300) INJECTION 61% COMPARISON:  None. FINDINGS: Lower chest: There is cardiomegaly. No pericardial pleural effusion. Dependent atelectasis is seen in the lung bases. The distal esophagus is dilated and fluid-filled. Hepatobiliary: Multiple stones are identified in the gallbladder. There is pericholecystic fluid. Mild intrahepatic biliary ductal dilatation is seen. The common bile duct is also mildly dilated at 0.8 cm. No focal liver lesion is identified. Pancreas: The pancreas is atrophic. No focal lesion or surrounding inflammatory change. Spleen: Normal in size. Adrenals/Urinary Tract:  The adrenal glands appear normal. The patient has extensive cyst formation in the left kidney. A few small low attenuating lesions in the right kidney are likely cysts. Urinary bladder is unremarkable. Stomach/Bowel: The stomach is fluid-filled. Hiatal hernia is noted. No evidence of small-bowel obstruction is identified. Prominent gas and stool in the ascending colon and proximal transverse colon are seen. The patient has extensive sigmoid diverticulosis. The appendix is not visualized but no pericecal inflammatory process is seen. No pneumatosis, portal venous gas or free intraperitoneal air is seen. Vascular/Lymphatic: Extensive aortoiliac atherosclerosis is seen. No aneurysm or branch vessel occlusion identified. Reproductive: Status post hysterectomy. No adnexal masses. Other: No fluid collection. Musculoskeletal: The patient has severe multilevel thoracic and lumbar spondylosis. Convex left lumbar scoliosis is noted. No acute abnormality or focal lesion. IMPRESSION: No CT signs of  bowel ischemia.  Negative for bowel obstruction. Multiple gallstones with gallbladder wall thickening and/or pericholecystic fluid. If there is concern for cholecystitis, right upper quadrant ultrasound could be used for further evaluation. There is mild intra and extrahepatic biliary ductal dilatation but no common duct stone is identified. Extensive sigmoid diverticulosis without evidence diverticulitis. Fluid filled distal esophagus with a hiatal hernia could be due to reflux and/or poor motility. Gallstones with pericholecystic fluid and gallbladder wall thickening and mild dilatation of the common bile duct. Aortoiliac atherosclerosis without aneurysm. Cardiomegaly. Electronically Signed   By: Drusilla Kanner M.D.   On: 12/09/2016 11:53   Dg Chest Port 1 View  Result Date: 12/09/2016 CLINICAL DATA:  Evaluate for aspiration EXAM: PORTABLE CHEST 1 VIEW COMPARISON:  12/09/2016 FINDINGS: NG tube extends the stomach. Normal cardiac silhouette with LEFT basilar atelectasis. No effusion. No pulmonary edema. No pneumothorax. IMPRESSION: 1. NG tube extends into stomach. 2. LEFT basilar atelectasis. Electronically Signed   By: Genevive Bi M.D.   On: 12/09/2016 21:08   Dg Chest Port 1 View  Result Date: 12/09/2016 CLINICAL DATA:  Shortness of breath EXAM: PORTABLE CHEST 1 VIEW COMPARISON:  None. FINDINGS: Mild left basilar atelectasis. Right lung is clear. No pleural effusion or pneumothorax. The heart is normal in size. Degenerative changes of the bilateral shoulders. IMPRESSION: No evidence of acute cardiopulmonary disease. Electronically Signed   By: Charline Bills M.D.   On: 12/09/2016 09:03   US Abdomen Limited Ruq  Result Date: 12/09/2016 CLINICAL DATA:  Cholelithiasis on CT of the abdomen EXAM: ULTRASOUND ABDOMEN LIMITED RIGHT UPPER QUADRANT COMPARISON:  None. FINDINGS: Gallbladder: Multiple gallstones within lumen gallbladder. The gallbladder wall is mildly thickened at 4 mm. No  pericholecystic fluid. Negative sonographic Murphy's sign. Common bile duct: Diameter: Normal at 5 mm Liver: No focal lesion identified. Within normal limits in parenchymal echogenicity. Portal vein is patent on color Doppler imaging with normal direction of blood flow towards the liver. IMPRESSION: 1. Multiple gallstones fill the lumen of the gallbladder with mild gallbladder wall thickening. No sonographic Murphy's sign. No pericholecystic fluid. 2. Normal common bile duct . Electronically Signed   By: Genevive Bi M.D.   On: 12/09/2016 15:27     STUDIES:  Upper endoscopy Impression:  - Significant dilatation noted throughout the length of the esophagus. - LA Grade C reflux esophagitis. - Large hiatal hernia; large amount of black debris in the stomach; no fresh heme noted. - Normal duodenal bulb and proximal small bowel. - No specimens collected.  CULTURES: Stool PCR Blood on 10/14 x23 Urine 10/14  ANTIBIOTICS: Zosyn as per pharmacy  SIGNIFICANT EVENTS: Admission   LINES/TUBES: PIV right  hand and left forearm  DISCUSSION: 83 year old female who apparently was in usual state of health up until10/14 when found incontinent of bowel and bladder and confused. She apparently was covered with dark colored stool as per EMS report. She had several loose dark stools since presentation to the ER prompting PCR for C. diff. Her admitting working diagnosis is an upper GI bleed given history of NSAIDs use and prior peptic ulcer disease in conjunction with the endoscopy findings. Although she still somewhat encephalopathic she did raise concern about "eating something bad". She was given blood. She was admitted to the ICU given her high risk for hemodynamic deterioration.  ASSESSMENT / PLAN:  PULMONARY A: Saturating well on 2L via nasal cannula  No acute distress P:   Continue to monitor   CARDIOVASCULAR A:   Afib with RVR no previous history of afib Troponin remained elevated, but  decreasing Hypokalemic with associated hypomagnesemia  P:   On metoprolol 2.5mg  IV q3 hr for HTN Will consider rate control today as patient is improving  Both K+ and Mg+ repleted by overnight MD   RENAL A:   Mild acute kidney injury on admission CMP today revealed continued improvement in renal function P:   Continue IV fluids at 62ml/hr with lactated ringers solution Holding antihypertensives   GASTROINTESTINAL A:  Acute UGIB w/ blood loss and anemia. Most likely thought to be secondary to  PUD and NSAID use EGD by GI demonstrated dark matter in the gastric lumen Given two units of PRBC's P:  PPI at  IV q12 hrs Hold all NSAIDS Await GI recommendations    HEMATOLOGIC A:   Symptomatic anemia at 7.2 S/p PRBC transfusion x 2 units P:  CBC up today, continue to monitor  Patient stable and currently improving mentally  Repeat CBC q6 order placed, will treat Hgb as indicated  INFECTIOUS A:   SIRS w/ high lactic acid without clear indication for infection on admission  Will evaluate for continue need of antibiotics today Afebrile without leukocytosis today No clear indication for C diff, PCR pending P:   Currently on Zosyn as per pharmacy   ENDOCRINE A:   Mild Hyperglycemia initially, no history of DMII Glucose has greatly improved today, P:   Will continue to monitor and consider treating as indicated  NEUROLOGIC A:   Less confused today, alert and oriented to name and location  P:   Continue current therapy   FAMILY  - Updates: pending   - Inter-disciplinary family meet or Palliative Care meeting due by:  10/21    Pulmonary and Critical Care Medicine [facility] Pager: [phone]  12/10/2016, 7:23 AM   STAFF NOTE: I, Rory Percy, MD FACP have personally reviewed patient's available data, including medical history, events of note, physical examination and test results as part of my evaluation. I have discussed with resident/NP  and other care providers such as pharmacist, RN and RRT. In addition, I personally evaluated patient and elicited key findings of: awake, alert, no distress. BP adequate, lungs clear bilateral, in fib , HR controlled overall slight tachy, follows commands, pcxr which I reviewed shows no well defined infiltrates, no fevers, I do not see aspiration, dc abx, advance diet per GI, EGD reviewed unimpressive per GI, PPI, cbc to keep q6h, hypokalemia, supp K  ,phos, mag and re assess in afternoon, would not resus with fluids, use PRBC, mobilize as able In terms of fib, maintain rate control is successful overall, if needed add home calcium  channel, add oral cardizem 30 q12h Mcarthur Rossetti. Tyson Alias, MD, FACP Pgr: [phone] [facility] 12/10/2016 10:41 AM

## 2016-12-10 NOTE — Progress Notes (Signed)
eLink Physician-Brief Progress Note Patient Name: JAKALYN KRATKY DOB: 1920-05-07 MRN: [id number]   Date of Service  12/10/2016  HPI/Events of Note  K+ = 2.8, PO4--- = 2.2, Mg++ = 1.4 and Creatinine = 0.86  eICU Interventions  Will replace K+, Mg++ and PO4---.     Intervention Category Major Interventions: Electrolyte abnormality - evaluation and management  Sommer,Steven Eugene 12/10/2016, 6:22 AM

## 2016-12-10 NOTE — Progress Notes (Signed)
[facility] Progress Note    Since last GI note: EGD yesterday Dr. Loreta Ave for melena, anemia: dilated esophagus, severe esophagitis with oozing of blood, large HH noted. NG tube was placed following EGD but then removed last night.  She received 2 units PRBCs yesterday afternoon, prior to EGD; Hb drift 10.2 after those units to 8.7 this morning.  No overt bleeding.  She reports no abd pain, no nausea or vomiting.  Not sure what led to her being admitted (memory?)  Objective: Vital signs in last 24 hours: Temp:  [93 F (33.9 C)-100.3 F (37.9 C)] 99.1 F (37.3 C) (10/15 0400) Pulse Rate:  [77-163] 113 (10/15 0700) Resp:  [16-31] 25 (10/15 0300) BP: (83-141)/(36-114) 99/56 (10/15 0700) SpO2:  [75 %-100 %] 100 % (10/15 0700) Weight:  [122 lb 2.2 oz (55.4 kg)-139 lb (63 kg)] 126 lb 1.7 oz (57.2 kg) (10/15 0400) Last BM Date: 12/09/16 General: alert and oriented times 3 Heart: regular rate and rythm Abdomen: soft, non-tender, non-distended, normal bowel sounds   Lab Results:  Recent Labs  12/09/16 0907 12/09/16 2111 12/10/16 0413  WBC 11.9* 12.1* 9.8  HGB 7.2* 10.2* 8.7*  PLT 234 186 151  MCV 95.3 91.1 90.4    Recent Labs  12/09/16 0907 12/09/16 2111 12/10/16 0413  NA 137 137 140  K 3.0* 2.5* 2.8*  CL 101 104 109  CO2 15* 22 21*  GLUCOSE 182* 113* 93  BUN 57* 52* 44*  CREATININE 1.25* 0.98 0.86  CALCIUM 8.4* 7.8* 7.7*    Recent Labs  12/09/16 0907 12/09/16 2111 12/10/16 0413  PROT 5.3* 5.3* 4.7*  ALBUMIN 2.8* 2.8* 2.4*  AST 44* 44* 34  ALT ALKPHOS 42 36* 30*  BILITOT 0.6 0.6 0.9    Recent Labs  12/09/16 0907 12/09/16 2111  INR 1.24 1.26     Studies/Results: Dg Abd 1 View  Result Date: 12/09/2016 CLINICAL DATA:  NG tube placement EXAM: ABDOMEN - 1 VIEW COMPARISON:  None. FINDINGS: NG tube extends into the gastric fundus. LEFT basilar atelectasis. No dilated loops of bowel. IMPRESSION: NG tube in stomach. Electronically  Signed   By: Genevive Bi M.D.   On: 12/09/2016 21:10   Ct Abdomen Pelvis W Contrast  Result Date: 12/09/2016 CLINICAL DATA:  Nausea and vomiting with an elevated lactic acid and decreased hemoglobin. Elevated white blood cell count. EXAM: CT ABDOMEN AND PELVIS WITH CONTRAST TECHNIQUE: Multidetector CT imaging of the abdomen and pelvis was performed using the standard protocol following bolus administration of intravenous contrast. CONTRAST:  80 ml ISOVUE-300 IOPAMIDOL (ISOVUE-300) INJECTION 61% COMPARISON:  None. FINDINGS: Lower chest: There is cardiomegaly. No pericardial pleural effusion. Dependent atelectasis is seen in the lung bases. The distal esophagus is dilated and fluid-filled. Hepatobiliary: Multiple stones are identified in the gallbladder. There is pericholecystic fluid. Mild intrahepatic biliary ductal dilatation is seen. The common bile duct is also mildly dilated at 0.8 cm. No focal liver lesion is identified. Pancreas: The pancreas is atrophic. No focal lesion or surrounding inflammatory change. Spleen: Normal in size. Adrenals/Urinary Tract: The adrenal glands appear normal. The patient has extensive cyst formation in the left kidney. A few small low attenuating lesions in the right kidney are likely cysts. Urinary bladder is unremarkable. Stomach/Bowel: The stomach is fluid-filled. Hiatal hernia is noted. No evidence of small-bowel obstruction is identified. Prominent gas and stool in the ascending colon and proximal transverse colon are seen. The patient has extensive sigmoid diverticulosis. The appendix  is not visualized but no pericecal inflammatory process is seen. No pneumatosis, portal venous gas or free intraperitoneal air is seen. Vascular/Lymphatic: Extensive aortoiliac atherosclerosis is seen. No aneurysm or branch vessel occlusion identified. Reproductive: Status post hysterectomy. No adnexal masses. Other: No fluid collection. Musculoskeletal: The patient has severe  multilevel thoracic and lumbar spondylosis. Convex left lumbar scoliosis is noted. No acute abnormality or focal lesion. IMPRESSION: No CT signs of bowel ischemia.  Negative for bowel obstruction. Multiple gallstones with gallbladder wall thickening and/or pericholecystic fluid. If there is concern for cholecystitis, right upper quadrant ultrasound could be used for further evaluation. There is mild intra and extrahepatic biliary ductal dilatation but no common duct stone is identified. Extensive sigmoid diverticulosis without evidence diverticulitis. Fluid filled distal esophagus with a hiatal hernia could be due to reflux and/or poor motility. Gallstones with pericholecystic fluid and gallbladder wall thickening and mild dilatation of the common bile duct. Aortoiliac atherosclerosis without aneurysm. Cardiomegaly. Electronically Signed   By: Drusilla Kanner M.D.   On: 12/09/2016 11:53   Dg Chest Port 1 View  Result Date: 12/09/2016 CLINICAL DATA:  Evaluate for aspiration EXAM: PORTABLE CHEST 1 VIEW COMPARISON:  12/09/2016 FINDINGS: NG tube extends the stomach. Normal cardiac silhouette with LEFT basilar atelectasis. No effusion. No pulmonary edema. No pneumothorax. IMPRESSION: 1. NG tube extends into stomach. 2. LEFT basilar atelectasis. Electronically Signed   By: Genevive Bi M.D.   On: 12/09/2016 21:08   Dg Chest Port 1 View  Result Date: 12/09/2016 CLINICAL DATA:  Shortness of breath EXAM: PORTABLE CHEST 1 VIEW COMPARISON:  None. FINDINGS: Mild left basilar atelectasis. Right lung is clear. No pleural effusion or pneumothorax. The heart is normal in size. Degenerative changes of the bilateral shoulders. IMPRESSION: No evidence of acute cardiopulmonary disease. Electronically Signed   By: Charline Bills M.D.   On: 12/09/2016 09:03   US Abdomen Limited Ruq  Result Date: 12/09/2016 CLINICAL DATA:  Cholelithiasis on CT of the abdomen EXAM: ULTRASOUND ABDOMEN LIMITED RIGHT UPPER QUADRANT  COMPARISON:  None. FINDINGS: Gallbladder: Multiple gallstones within lumen gallbladder. The gallbladder wall is mildly thickened at 4 mm. No pericholecystic fluid. Negative sonographic Murphy's sign. Common bile duct: Diameter: Normal at 5 mm Liver: No focal lesion identified. Within normal limits in parenchymal echogenicity. Portal vein is patent on color Doppler imaging with normal direction of blood flow towards the liver. IMPRESSION: 1. Multiple gallstones fill the lumen of the gallbladder with mild gallbladder wall thickening. No sonographic Murphy's sign. No pericholecystic fluid. 2. Normal common bile duct . Electronically Signed   By: Genevive Bi M.D.   On: 12/09/2016 15:27     Medications: Scheduled Meds: . chlorhexidine  15 mL Mouth Rinse BID  . mouth rinse  15 mL Mouth Rinse q12n4p  . ondansetron (ZOFRAN) IV  4 mg Intravenous Once  . [START ON 12/13/2016] pantoprazole  40 mg Intravenous Q12H   Continuous Infusions: . sodium chloride 10 mL/hr at 12/10/16 0700  . lactated ringers 75 mL/hr at 12/10/16 0700  . pantoprozole (PROTONIX) infusion 8 mg/hr (12/10/16 0700)  . piperacillin-tazobactam (ZOSYN)  IV 3.375 g (12/10/16 0430)  . potassium chloride Stopped (12/10/16 0729)  . sodium phosphate  Dextrose 5% IVPB 20 mmol (12/10/16 0645)   PRN Meds:.metoprolol tartrate, [DISCONTINUED] ondansetron **OR** ondansetron (ZOFRAN) IV    Assessment/Plan: 83 y.o. female with melena, anemia  EGD yesterday showed large HH and oozing esophagitis. This is probably the source of her melena, anemia.  I think it is  OK to advance her diet to clears and observe her clinically for now. Will decrease her PPI to IV every 12 hours as well.  May need re-look EGD depending on her clinical course.   Rachael Fee, MD  12/10/2016, 8:11 AM [facility] Pager [phone]

## 2016-12-10 NOTE — Progress Notes (Signed)
eLink Physician-Brief Progress Note Patient Name: Kelly Ramirez DOB: 1920/12/19 MRN: [id number]   Date of Service  12/10/2016  HPI/Events of Note  Multiple issues: 1. Hgb = 10.2, K+ = 2.5 (however, patient is currently getting K+ replacement) and 3. Troponin = 0.78 - Demand ischemia?  eICU Interventions  Will order: 1. Send AM labs after K+ replacement finished. 2. Metoprolol 2.5 mg IV Q 3 hours PRN HR > 115. 3. Continue to cycle Troponin,     Intervention Category Intermediate Interventions: Diagnostic test evaluation  [name] 12/10/2016, 12:03 AM

## 2016-12-11 DIAGNOSIS — E876 Hypokalemia: Secondary | ICD-10-CM

## 2016-12-11 LAB — BASIC METABOLIC PANEL
ANION GAP: 5 (ref 5–15)
BUN: 27 mg/dL — ABNORMAL HIGH (ref 6–20)
CHLORIDE: 111 mmol/L (ref 101–111)
CO2: 25 mmol/L (ref 22–32)
Calcium: 8.1 mg/dL — ABNORMAL LOW (ref 8.9–10.3)
Creatinine, Ser: 0.9 mg/dL (ref 0.44–1.00)
GFR calc non Af Amer: 53 mL/min — ABNORMAL LOW (ref >60)
Glucose, Bld: 96 mg/dL (ref 65–99)
POTASSIUM: 3.2 mmol/L — AB (ref 3.5–5.1)
SODIUM: 141 mmol/L (ref 135–145)

## 2016-12-11 LAB — URINE CULTURE: CULTURE: NO GROWTH

## 2016-12-11 LAB — CBC
HEMATOCRIT: 24.6 % — AB (ref 36.0–46.0)
Hemoglobin: 8.2 g/dL — ABNORMAL LOW (ref 12.0–15.0)
MCH: 31.2 pg (ref 26.0–34.0)
MCHC: 33.3 g/dL (ref 30.0–36.0)
MCV: 93.5 fL (ref 78.0–100.0)
Platelets: 158 10*3/uL (ref 150–400)
RBC: 2.63 MIL/uL — AB (ref 3.87–5.11)
RDW: 16.1 % — ABNORMAL HIGH (ref 11.5–15.5)
WBC: 11.5 10*3/uL — AB (ref 4.0–10.5)

## 2016-12-11 MED ORDER — POTASSIUM CHLORIDE CRYS ER 20 MEQ PO TBCR
40.0000 meq | EXTENDED_RELEASE_TABLET | Freq: Three times a day (TID) | ORAL | Status: AC
  Administered 2016-12-11 – 2016-12-12 (×3): 40 meq via ORAL
  Filled 2016-12-11 (×3): qty 2

## 2016-12-11 NOTE — Progress Notes (Signed)
PROGRESS NOTE    Kelly Ramirez  ZOX:[id number] DOB: Feb 24, 1921 DOA: 12/09/2016 PCP: Jackie Plum, MD   Brief Narrative:  83 year-old female with a history of esophagitis and chronic NSAID use presented on 10/14 with altered mental status and bloody stools. In ED, pt had significant anemia and continued to have melanic stools. 2 units PRBC's were infused at that time. Pt also had elevated lactic acid and SIRS criteria. There was some initial concern for intra-abdominal infection based on CT findings, and pt was started on IV Zosyn. On presentation, pt was in AF with RVR and troponin was elevated, likely due to demand ischemia. EGD showed bleeding esophagitis and large hiatal hernia. Pt was admitted to ICU and stabilized. Transferred to floor yesterday.   Assessment & Plan:   Principal Problem:   Acute upper GI bleed Active Problems:   Symptomatic anemia   Acute encephalopathy   HTN (hypertension)   Hypothermia   Acute kidney injury (HCC)   HLD (hyperlipidemia)   Metabolic acidosis, increased anion gap   UGIB (upper gastrointestinal bleed)  #Acute upper GI bleed: GI following. EGD on 10/14 shows large hiatal hernia and bleeding esophagitis. Pt initially on PPI gtt, now on IV PPI BID. Pt reports no stools yesterday or this morning. She is tolerating clear liquids well. Advancing diet today and continuing IV PPI. Will continue to follow hemoglobin today. If anemia persists, plan for repeat EGD tomorrow. Pt will be NPO after midnight in preparation for this eventuality, as per GI recommendation.      #Anemia: No obvious bleeding at this point. Pt has not had BM yesterday or today. Will continue to follow hemoglobin today. Plan for repeat EGD tomorrow if anemia persists as per GI recommendation.  #Atrial Fibrillation with RVR: Currently rate controlled on diltiazem with max HR 116 in past 24 hours. This is somewhat complicated by intermittent hypotension. Troponin elevated but trending  down, last value=0.44. Likely represents demand ischemia. No role for anti-coagulation given GI bleeding. Continue to monitor HR and continue diltiazem BID.   #Acute encephalopathy: Resolving. Pt is oriented to person and place. Pt knows that next month is November and that is her birth month. She is not, however, clear on why she is admitted to the hospital.   #Hypertension: Pt on diltiazem BID for control of AF, blood pressure has remained low to normal. No additional treatment for HTN at this time.   #Acute kidney injury: Improving. Last BUN=26, cr=0.9.   #Hyperlipidemia: Pt on Zocor.  #Wide anion gap metabolic acidosis: Resolved. Last AG=5.0.        DVT prophylaxis: Code Status: Full code Family Communication: Disposition Plan: Currently admitted    Consultants:   Gastroenterology   Procedures: Antimicrobials: IV Zosyn on admission EGD on 10/14  Subjective: Pt evaluated at bedside. Mental status seems improved. Pt is tolerating clear liquid diet and has not had BM yesterday or today. No chest pain, dyspnea, or n/v.   Objective: Vitals:   12/10/16 1128 12/10/16 1200 12/10/16 2232 12/11/16 0555  BP: 104/69 (!) 100/53 (!) 95/49 (!) 96/53  Pulse:  98 97 90  Resp:   17 17  Temp:   99.2 F (37.3 C) 98.4 F (36.9 C)  TempSrc:   Oral Oral  SpO2:  100% 97% 99%  Weight:   60.3 kg (132 lb 14.4 oz)     Intake/Output Summary (Last 24 hours) at 12/11/16 1045 Last data filed at 12/11/16 0903  Gross per 24 hour  Intake  191.67 ml  Output              501 ml  Net          -309.33 ml   Filed Weights   12/09/16 2040 12/10/16 0400 12/10/16 2232  Weight: 55.4 kg (122 lb 2.2 oz) 57.2 kg (126 lb 1.7 oz) 60.3 kg (132 lb 14.4 oz)    Examination:  General exam: Appears calm and comfortable  Respiratory system: Clear to auscultation. Respiratory effort normal. No wheezing or crackle Cardiovascular system: S1 & S2 heard, Irregularly irregular rhythm present. No pedal  edema. Gastrointestinal system: Abdomen is nondistended, soft and nontender. Normal bowel sounds heard. Central nervous system: Alert and oriented to person, place, and time. Pt not oriented to event. No focal neurological deficits. Extremities: Symmetric 5 x 5 power. Skin: No rashes, lesions or ulcers Psychiatry: Judgement and insight appear normal. Mood & affect appropriate.     Data Reviewed: I have personally reviewed following labs and imaging studies  CBC:  Recent Labs Lab 12/09/16 0907 12/09/16 2111 12/10/16 0413 12/10/16 1034 12/10/16 1416 12/10/16 2320 12/11/16 0809  WBC 11.9* 12.1* 9.8 11.9* 10.6* 9.8 11.5*  NEUTROABS 9.2* 9.9*  --   --   --   --   --   HGB 7.2* 10.2* 8.7* 9.6* 8.7* 7.9* 8.2*  HCT 22.3* 29.8* 25.4* 27.8* 25.9* 23.2* 24.6*  MCV 95.3 91.1 90.4 91.7 91.5 91.3 93.5  PLT 234 186 151 156 155 159 158   Basic Metabolic Panel:  Recent Labs Lab 12/09/16 0907 12/09/16 2111 12/10/16 0413 12/10/16 1416 12/11/16 0809  NA 137 137 140 141 141  K 3.0* 2.5* 2.8* 3.6 3.2*  CL 101 104 109 110 111  CO2 15* 22 21* 23 25  GLUCOSE 182* 113* 93 109* 96  BUN 57* 52* 44* 35* 27*  CREATININE 1.25* 0.98 0.86 0.98 0.90  CALCIUM 8.4* 7.8* 7.7* 7.9* 8.1*  MG  --   --  1.4*  --   --   PHOS  --   --  2.2*  --   --    GFR: CrCl cannot be calculated (Unknown ideal weight.). Liver Function Tests:  Recent Labs Lab 12/09/16 0907 12/09/16 2111 12/10/16 0413  AST 44* 44* 34  ALT ALKPHOS 42 36* 30*  BILITOT 0.6 0.6 0.9  PROT 5.3* 5.3* 4.7*  ALBUMIN 2.8* 2.8* 2.4*    Recent Labs Lab 12/09/16 2111  LIPASE 17   No results for input(s): AMMONIA in the last 168 hours. Coagulation Profile:  Recent Labs Lab 12/09/16 0907 12/09/16 2111  INR 1.24 1.26   Cardiac Enzymes:  Recent Labs Lab 12/09/16 0907 12/09/16 2111 12/10/16 0413  CKTOTAL  --  186  --   TROPONINI 0.09* 0.78* 0.44*   BNP (last 3 results) No results for input(s): PROBNP in the  last 8760 hours. HbA1C: No results for input(s): HGBA1C in the last 72 hours. CBG: No results for input(s): GLUCAP in the last 168 hours. Lipid Profile: No results for input(s): CHOL, HDL, LDLCALC, TRIG, CHOLHDL, LDLDIRECT in the last 72 hours. Thyroid Function Tests:  Recent Labs  12/09/16 1216  TSH 0.946   Anemia Panel:  Recent Labs  12/09/16 2111  VITAMINB12 484  FOLATE 9.7  FERRITIN 58  TIBC 260  IRON 15*  RETICCTPCT 2.4   Sepsis Labs:  Recent Labs Lab 12/09/16 0939 12/09/16 1216 12/09/16 1225 12/09/16 1805 12/09/16 2111  PROCALCITON  --   --   --   --  5.12  LATICACIDVEN 13.97* 5.2* 5.16* 1.86  --     Recent Results (from the past 240 hour(s))  Culture, blood (routine x 2)     Status: None (Preliminary result)   Collection Time: 12/09/16  9:00 AM  Result Value Ref Range Status   Specimen Description BLOOD LEFT FOREARM  Final   Special Requests   Final    BOTTLES DRAWN AEROBIC AND ANAEROBIC Blood Culture adequate volume   Culture NO GROWTH < 24 HOURS  Final   Report Status PENDING  Incomplete  Urine culture     Status: None   Collection Time: 12/09/16  2:29 PM  Result Value Ref Range Status   Specimen Description URINE, CATHETERIZED  Final   Special Requests NONE  Final   Culture NO GROWTH  Final   Report Status 12/11/2016 FINAL  Final  MRSA PCR Screening     Status: None   Collection Time: 12/09/16  8:48 PM  Result Value Ref Range Status   MRSA by PCR NEGATIVE NEGATIVE Final    Comment:        The GeneXpert MRSA Assay (FDA approved for NASAL specimens only), is one component of a comprehensive MRSA colonization surveillance program. It is not intended to diagnose MRSA infection nor to guide or monitor treatment for MRSA infections.   Gastrointestinal Panel by PCR , Stool     Status: None   Collection Time: 12/10/16  4:48 AM  Result Value Ref Range Status   Campylobacter species NOT DETECTED NOT DETECTED Final   Plesimonas shigelloides NOT  DETECTED NOT DETECTED Final   Salmonella species NOT DETECTED NOT DETECTED Final   Yersinia enterocolitica NOT DETECTED NOT DETECTED Final   Vibrio species NOT DETECTED NOT DETECTED Final   Vibrio cholerae NOT DETECTED NOT DETECTED Final   Enteroaggregative E coli (EAEC) NOT DETECTED NOT DETECTED Final   Enteropathogenic E coli (EPEC) NOT DETECTED NOT DETECTED Final   Enterotoxigenic E coli (ETEC) NOT DETECTED NOT DETECTED Final   Shiga like toxin producing E coli (STEC) NOT DETECTED NOT DETECTED Final   Shigella/Enteroinvasive E coli (EIEC) NOT DETECTED NOT DETECTED Final   Cryptosporidium NOT DETECTED NOT DETECTED Final   Cyclospora cayetanensis NOT DETECTED NOT DETECTED Final   Entamoeba histolytica NOT DETECTED NOT DETECTED Final   Giardia lamblia NOT DETECTED NOT DETECTED Final   Adenovirus F40/41 NOT DETECTED NOT DETECTED Final   Astrovirus NOT DETECTED NOT DETECTED Final   Norovirus GI/GII NOT DETECTED NOT DETECTED Final   Rotavirus A NOT DETECTED NOT DETECTED Final   Sapovirus (I, II, IV, and V) NOT DETECTED NOT DETECTED Final         Radiology Studies: Dg Abd 1 View  Result Date: 12/09/2016 CLINICAL DATA:  NG tube placement EXAM: ABDOMEN - 1 VIEW COMPARISON:  None. FINDINGS: NG tube extends into the gastric fundus. LEFT basilar atelectasis. No dilated loops of bowel. IMPRESSION: NG tube in stomach. Electronically Signed   By: Genevive Bi M.D.   On: 12/09/2016 21:10   Ct Abdomen Pelvis W Contrast  Result Date: 12/09/2016 CLINICAL DATA:  Nausea and vomiting with an elevated lactic acid and decreased hemoglobin. Elevated white blood cell count. EXAM: CT ABDOMEN AND PELVIS WITH CONTRAST TECHNIQUE: Multidetector CT imaging of the abdomen and pelvis was performed using the standard protocol following bolus administration of intravenous contrast. CONTRAST:  80 ml ISOVUE-300 IOPAMIDOL (ISOVUE-300) INJECTION 61% COMPARISON:  None. FINDINGS: Lower chest: There is cardiomegaly.  No pericardial pleural effusion. Dependent atelectasis  is seen in the lung bases. The distal esophagus is dilated and fluid-filled. Hepatobiliary: Multiple stones are identified in the gallbladder. There is pericholecystic fluid. Mild intrahepatic biliary ductal dilatation is seen. The common bile duct is also mildly dilated at 0.8 cm. No focal liver lesion is identified. Pancreas: The pancreas is atrophic. No focal lesion or surrounding inflammatory change. Spleen: Normal in size. Adrenals/Urinary Tract: The adrenal glands appear normal. The patient has extensive cyst formation in the left kidney. A few small low attenuating lesions in the right kidney are likely cysts. Urinary bladder is unremarkable. Stomach/Bowel: The stomach is fluid-filled. Hiatal hernia is noted. No evidence of small-bowel obstruction is identified. Prominent gas and stool in the ascending colon and proximal transverse colon are seen. The patient has extensive sigmoid diverticulosis. The appendix is not visualized but no pericecal inflammatory process is seen. No pneumatosis, portal venous gas or free intraperitoneal air is seen. Vascular/Lymphatic: Extensive aortoiliac atherosclerosis is seen. No aneurysm or branch vessel occlusion identified. Reproductive: Status post hysterectomy. No adnexal masses. Other: No fluid collection. Musculoskeletal: The patient has severe multilevel thoracic and lumbar spondylosis. Convex left lumbar scoliosis is noted. No acute abnormality or focal lesion. IMPRESSION: No CT signs of bowel ischemia.  Negative for bowel obstruction. Multiple gallstones with gallbladder wall thickening and/or pericholecystic fluid. If there is concern for cholecystitis, right upper quadrant ultrasound could be used for further evaluation. There is mild intra and extrahepatic biliary ductal dilatation but no common duct stone is identified. Extensive sigmoid diverticulosis without evidence diverticulitis. Fluid filled distal  esophagus with a hiatal hernia could be due to reflux and/or poor motility. Gallstones with pericholecystic fluid and gallbladder wall thickening and mild dilatation of the common bile duct. Aortoiliac atherosclerosis without aneurysm. Cardiomegaly. Electronically Signed   By: Drusilla Kanner M.D.   On: 12/09/2016 11:53   Dg Chest Port 1 View  Result Date: 12/09/2016 CLINICAL DATA:  Evaluate for aspiration EXAM: PORTABLE CHEST 1 VIEW COMPARISON:  12/09/2016 FINDINGS: NG tube extends the stomach. Normal cardiac silhouette with LEFT basilar atelectasis. No effusion. No pulmonary edema. No pneumothorax. IMPRESSION: 1. NG tube extends into stomach. 2. LEFT basilar atelectasis. Electronically Signed   By: Genevive Bi M.D.   On: 12/09/2016 21:08   US Abdomen Limited Ruq  Result Date: 12/09/2016 CLINICAL DATA:  Cholelithiasis on CT of the abdomen EXAM: ULTRASOUND ABDOMEN LIMITED RIGHT UPPER QUADRANT COMPARISON:  None. FINDINGS: Gallbladder: Multiple gallstones within lumen gallbladder. The gallbladder wall is mildly thickened at 4 mm. No pericholecystic fluid. Negative sonographic Murphy's sign. Common bile duct: Diameter: Normal at 5 mm Liver: No focal lesion identified. Within normal limits in parenchymal echogenicity. Portal vein is patent on color Doppler imaging with normal direction of blood flow towards the liver. IMPRESSION: 1. Multiple gallstones fill the lumen of the gallbladder with mild gallbladder wall thickening. No sonographic Murphy's sign. No pericholecystic fluid. 2. Normal common bile duct . Electronically Signed   By: Genevive Bi M.D.   On: 12/09/2016 15:27        Scheduled Meds: . chlorhexidine  15 mL Mouth Rinse BID  . diltiazem  30 mg Oral Q12H  . mouth rinse  15 mL Mouth Rinse q12n4p  . ondansetron (ZOFRAN) IV  4 mg Intravenous Once  . pantoprazole (PROTONIX) IV  40 mg Intravenous Q12H   Continuous Infusions: . sodium chloride 10 mL/hr at 12/10/16 0700     LOS:  2 days    Maia Plan, PA-S [facility] Pager [phone]  If 7PM-7AM, please contact night-coverage [email or web address] Password [email or web address] 12/11/2016, 10:45 AM

## 2016-12-11 NOTE — Progress Notes (Signed)
Pt still eating lunch.

## 2016-12-11 NOTE — Progress Notes (Signed)
[facility] Progress Note    Since last GI note: RN reports that she had no BMs overnight or yesterday. Patients memory quite spotty and she does not recall.  Tolerating clear liquids   Objective: Vital signs in last 24 hours: Temp:  [98.4 F (36.9 C)-99.2 F (37.3 C)] 98.4 F (36.9 C) (10/16 0555) Pulse Rate:  [74-116] 90 (10/16 0555) Resp:  [17] 17 (10/16 0555) BP: (95-112)/(49-76) 96/53 (10/16 0555) SpO2:  [93 %-100 %] 99 % (10/16 0555) Weight:  [132 lb 14.4 oz (60.3 kg)] 132 lb 14.4 oz (60.3 kg) (10/15 2232) Last BM Date: 12/10/16 General: alert and oriented times 1 Heart: regular rate and rythm Abdomen: soft, non-tender, non-distended, normal bowel sounds   Lab Results:  Recent Labs  12/10/16 1034 12/10/16 1416 12/10/16 2320  WBC 11.9* 10.6* 9.8  HGB 9.6* 8.7* 7.9*  PLT 156 155 159  MCV 91.7 91.5 91.3    Recent Labs  12/09/16 2111 12/10/16 0413 12/10/16 1416  NA 137 140 141  K 2.5* 2.8* 3.6  CL 104 109 110  CO2 22 21* 23  GLUCOSE 113* 93 109*  BUN 52* 44* 35*  CREATININE 0.98 0.86 0.98  CALCIUM 7.8* 7.7* 7.9*    Recent Labs  12/09/16 0907 12/09/16 2111 12/10/16 0413  PROT 5.3* 5.3* 4.7*  ALBUMIN 2.8* 2.8* 2.4*  AST 44* 44* 34  ALT ALKPHOS 42 36* 30*  BILITOT 0.6 0.6 0.9    Recent Labs  12/09/16 0907 12/09/16 2111  INR 1.24 1.26   Medications: Scheduled Meds: . chlorhexidine  15 mL Mouth Rinse BID  . diltiazem  30 mg Oral Q12H  . mouth rinse  15 mL Mouth Rinse q12n4p  . ondansetron (ZOFRAN) IV  4 mg Intravenous Once  . pantoprazole (PROTONIX) IV  40 mg Intravenous Q12H   Continuous Infusions: . sodium chloride 10 mL/hr at 12/10/16 0700   PRN Meds:.metoprolol tartrate, [DISCONTINUED] ondansetron **OR** ondansetron (ZOFRAN) IV    Assessment/Plan: 83 y.o. female GI bleed, dementia, frailty  EGD Sunday showed large HH and bleeding esophagitis.  This is the presumed source of her melena, anemia.  She's  been on IV PPI for about 48 hours now.  NO overt rebleeding but Hb has drifted.  I have some concern for ongoing, intermittent bleeding. Will follow closely, keep her on IV PPI twice daily for now.  Will advance diet today but make her NPO after MN in case her Hb continues to drift we'll be prepared for repeat EGD tomorrow.   Rachael Fee, MD  12/11/2016, 8:33 AM [facility] Pager [phone]

## 2016-12-12 ENCOUNTER — Other Ambulatory Visit (HOSPITAL_COMMUNITY): Payer: Medicare Other

## 2016-12-12 DIAGNOSIS — E860 Dehydration: Secondary | ICD-10-CM

## 2016-12-12 DIAGNOSIS — N179 Acute kidney failure, unspecified: Secondary | ICD-10-CM

## 2016-12-12 DIAGNOSIS — G9340 Encephalopathy, unspecified: Secondary | ICD-10-CM

## 2016-12-12 LAB — CBC
HEMATOCRIT: 23.9 % — AB (ref 36.0–46.0)
HEMOGLOBIN: 7.9 g/dL — AB (ref 12.0–15.0)
MCH: 31.1 pg (ref 26.0–34.0)
MCHC: 33.1 g/dL (ref 30.0–36.0)
MCV: 94.1 fL (ref 78.0–100.0)
Platelets: 175 10*3/uL (ref 150–400)
RBC: 2.54 MIL/uL — AB (ref 3.87–5.11)
RDW: 16.1 % — ABNORMAL HIGH (ref 11.5–15.5)
WBC: 12.3 10*3/uL — AB (ref 4.0–10.5)

## 2016-12-12 LAB — BASIC METABOLIC PANEL
ANION GAP: 5 (ref 5–15)
BUN: 25 mg/dL — ABNORMAL HIGH (ref 6–20)
CHLORIDE: 111 mmol/L (ref 101–111)
CO2: 25 mmol/L (ref 22–32)
CREATININE: 0.83 mg/dL (ref 0.44–1.00)
Calcium: 8.3 mg/dL — ABNORMAL LOW (ref 8.9–10.3)
GFR calc non Af Amer: 59 mL/min — ABNORMAL LOW (ref >60)
Glucose, Bld: 106 mg/dL — ABNORMAL HIGH (ref 65–99)
Potassium: 4.2 mmol/L (ref 3.5–5.1)
SODIUM: 141 mmol/L (ref 135–145)

## 2016-12-12 LAB — MAGNESIUM: MAGNESIUM: 1.9 mg/dL (ref 1.7–2.4)

## 2016-12-12 LAB — PREPARE RBC (CROSSMATCH)

## 2016-12-12 MED ORDER — SODIUM CHLORIDE 0.9 % IV SOLN
Freq: Once | INTRAVENOUS | Status: AC
  Administered 2016-12-12: 10:00:00 via INTRAVENOUS

## 2016-12-12 MED ORDER — GABAPENTIN 100 MG PO CAPS
100.0000 mg | ORAL_CAPSULE | Freq: Two times a day (BID) | ORAL | Status: DC
  Administered 2016-12-12 – 2016-12-14 (×5): 100 mg via ORAL
  Filled 2016-12-12 (×6): qty 1

## 2016-12-12 MED ORDER — DILTIAZEM HCL 30 MG PO TABS: 30.0000 mg | ORAL_TABLET | Freq: Two times a day (BID) | ORAL | Status: DC

## 2016-12-12 MED ORDER — VERAPAMIL HCL ER 240 MG PO TBCR
240.0000 mg | EXTENDED_RELEASE_TABLET | Freq: Every day | ORAL | Status: DC
  Administered 2016-12-13 – 2016-12-14 (×2): 240 mg via ORAL
  Filled 2016-12-12 (×2): qty 1

## 2016-12-12 MED ORDER — OXYBUTYNIN CHLORIDE 5 MG PO TABS
5.0000 mg | ORAL_TABLET | Freq: Two times a day (BID) | ORAL | Status: DC
  Administered 2016-12-12 – 2016-12-14 (×5): 5 mg via ORAL
  Filled 2016-12-12 (×6): qty 1

## 2016-12-12 NOTE — Progress Notes (Signed)
[facility] Progress Note    Since last GI note: She feels overall well. RN reports no obvious bleeding overnight.  She tolerated clears yesterday.  Objective: Vital signs in last 24 hours: Temp:  [98.1 F (36.7 C)-98.9 F (37.2 C)] 98.8 F (37.1 C) (10/17 0543) Pulse Rate:  [92-95] 92 (10/17 0543) Resp:  [16-17] 16 (10/17 0543) BP: (98-111)/(51-66) 107/60 (10/17 0543) SpO2:  [98 %-100 %] 100 % (10/17 0543) Weight:  [135 lb (61.2 kg)] 135 lb (61.2 kg) (10/16 2319) Last BM Date: 12/10/16 General: alert and oriented times 3 Heart: regular rate and rythm Abdomen: soft, non-tender, non-distended, normal bowel sounds   Lab Results:  Recent Labs  12/10/16 2320 12/11/16 0809 12/12/16 0255  WBC 9.8 11.5* 12.3*  HGB 7.9* 8.2* 7.9*  PLT 159 158 175  MCV 91.3 93.5 94.1    Recent Labs  12/10/16 1416 12/11/16 0809 12/12/16 0255  NA 141 141 141  K 3.6 3.2* 4.2  CL 110 111 111  CO2 23 25 25   GLUCOSE 109* 96 106*  BUN 35* 27* 25*  CREATININE 0.98 0.90 0.83  CALCIUM 7.9* 8.1* 8.3*    Recent Labs  12/09/16 0907 12/09/16 2111 12/10/16 0413  PROT 5.3* 5.3* 4.7*  ALBUMIN 2.8* 2.8* 2.4*  AST 44* 44* 34  ALT 16 22 19   ALKPHOS 42 36* 30*  BILITOT 0.6 0.6 0.9    Recent Labs  12/09/16 0907 12/09/16 2111  INR 1.24 1.26     Medications: Scheduled Meds: . chlorhexidine  15 mL Mouth Rinse BID  . diltiazem  30 mg Oral Q12H  . mouth rinse  15 mL Mouth Rinse q12n4p  . ondansetron (ZOFRAN) IV  4 mg Intravenous Once  . pantoprazole (PROTONIX) IV  40 mg Intravenous Q12H  . potassium chloride  40 mEq Oral TID   Continuous Infusions: . sodium chloride 10 mL/hr at 12/10/16 0700   PRN Meds:.metoprolol tartrate, [DISCONTINUED] ondansetron **OR** ondansetron (ZOFRAN) IV   Assessment/Plan: 83 y.o. female with melena, anemia, dementia  EGD Sunday showed large HH and bleeding esophagitis.  This is the presumed source of her melena, anemia. Presenting Hb was  7.2, went up to 10.2 after two units and has drifted to 7.9 this morning.  No overt bleeding per RN.  I suspect this is simply hemodilution and will hold on repeating EGD for now.  Will order 1 unit PRBC, advance her diet to solids and observe her clinically. Keep her on IV PPI twice daily for now.  Rachael FeeJacobs, Marci Polito P, MD  12/12/2016, 7:49 AM  [facility] Pager [phone]

## 2016-12-12 NOTE — Progress Notes (Signed)
PROGRESS NOTE    Kelly Ramirez   ZOX:[id number]  DOB: September 13, 1920  DOA: 12/09/2016 PCP: Jackie Plumsei-Bonsu, [name], MD   Brief Narrative:  Kelly Ramirez is a 83 year-old female with a history of esophagitis and chronic NSAID use presented on 10/14 with altered mental status and bloody stools. In ED, pt had significant anemia and continued to have melanic stools. 2 units PRBC's were infused at that time. Pt also had elevated lactic acid and SIRS criteria. There was some initial concern for intra-abdominal infection based on CT findings, and pt was started on IV Zosyn. She was also noted to be in AF with RVR with troponin was elevated which was likely due to demand ischemia. EGD 10/14 showed bleeding esophagitis and large hiatal hernia. Pt was admitted to ICU and stabilized. Transferred to floor 10/15.    Subjective:  no complaints today. ROS: no complaints of nausea, vomiting, constipation diarrhea, cough, dyspnea or dysuria. No other complaints.   Assessment & Plan:    Acute upper GI bleed -  EGD on 10/14 shows large hiatal hernia and bleeding esophagitis  - Pt initially on PPI gtt, now on IV PPI BID - no further bleeding noted- GI is starting diet today   Anemia -  Being transfused 1 U PRBC today for HB of 7.6 - this will be the 3rd unit transfused  Atrial Fibrillation with RVR - rate controlled on diltiazem when I evaluated him this AM - takes Verapamil at home which I will resume tomorrow AM at 120mg  rather than 240 mg due to low BP   Acute encephalopathy - Resolving.      Acute kidney injury - BUN 57, Cr 1.25 on 10/14 -  Improving. Last BUN=26, cr=0.9.    Hyperlipidemia - Zocor.  Hypotension with h/o HTN - holding Lisinopril/ HCTZ - transition Diltiazem to Verapamil  Anion gap metabolic acidosis -  Resolved. Last AG=5.0.      Urinary incontinence - resume Ditropan today  DVT prophylaxis: SCDs Code Status: Full code Family Communication:  Disposition Plan:  SNF recommended Consultants:   GI Procedures:   EGD Antimicrobials:  Anti-infectives    Start     Dose/Rate Route Frequency Ordered Stop   12/09/16 2000  piperacillin-tazobactam (ZOSYN) IVPB 3.375 g  Status:  Discontinued     3.375 g 12.5 mL/hr over 240 Minutes Intravenous Every 8 hours 12/09/16 1625 12/10/16 1045   12/09/16 1200  piperacillin-tazobactam (ZOSYN) IVPB 3.375 g     3.375 g 100 mL/hr over 30 Minutes Intravenous  Once 12/09/16 1159 12/09/16 1337       Objective: Vitals:   12/12/16 0543 12/12/16 0900 12/12/16 0950 12/12/16 1313  BP: 107/60 114/60 111/68 120/66  Pulse: 92 (!) 115 (!) 115 (!) 105  Resp: 16 16 17 16   Temp: 98.8 F (37.1 C) 98.6 F (37 C) 98.6 F (37 C) 98.8 F (37.1 C)  TempSrc: Oral Oral Oral Oral  SpO2: 100% 100% 98% 99%  Weight:        Intake/Output Summary (Last 24 hours) at 12/12/16 1500 Last data filed at 12/12/16 1400  Gross per 24 hour  Intake              500 ml  Output              475 ml  Net               25 ml   Filed Weights   12/10/16 0400 12/10/16 2232 12/11/16  2319  Weight: 57.2 kg (126 lb 1.7 oz) 60.3 kg (132 lb 14.4 oz) 61.2 kg (135 lb)    Examination: General exam: Appears comfortable  HEENT: PERRLA, oral mucosa moist, no sclera icterus or thrush Respiratory system: Clear to auscultation. Respiratory effort normal. Cardiovascular system: S1 & S2 heard, RRR.  No murmurs  Gastrointestinal system: Abdomen soft, non-tender, nondistended. Normal bowel sound. No organomegaly Central nervous system: Alert and oriented. No focal neurological deficits. Extremities: No cyanosis, clubbing or edema Skin: No rashes or ulcers Psychiatry:  Mood & affect appropriate.     Data Reviewed: I have personally reviewed following labs and imaging studies  CBC:  Recent Labs Lab 12/09/16 0907 12/09/16 2111  12/10/16 1034 12/10/16 1416 12/10/16 2320 12/11/16 0809 12/12/16 0255  WBC 11.9* 12.1*  < > 11.9* 10.6* 9.8 11.5* 12.3*   NEUTROABS 9.2* 9.9*  --   --   --   --   --   --   HGB 7.2* 10.2*  < > 9.6* 8.7* 7.9* 8.2* 7.9*  HCT 22.3* 29.8*  < > 27.8* 25.9* 23.2* 24.6* 23.9*  MCV 95.3 91.1  < > 91.7 91.5 91.3 93.5 94.1  PLT 234 186  < > 156 155 159 158 175  < > = values in this interval not displayed. Basic Metabolic Panel:  Recent Labs Lab 12/09/16 2111 12/10/16 0413 12/10/16 1416 12/11/16 0809 12/12/16 0255  NA 137 140 141 141 141  K 2.5* 2.8* 3.6 3.2* 4.2  CL 104 109 110 111 111  CO2 22 21* 23 25 25   GLUCOSE 113* 93 109* 96 106*  BUN 52* 44* 35* 27* 25*  CREATININE 0.98 0.86 0.98 0.90 0.83  CALCIUM 7.8* 7.7* 7.9* 8.1* 8.3*  MG  --  1.4*  --   --  1.9  PHOS  --  2.2*  --   --   --    GFR: CrCl cannot be calculated (Unknown ideal weight.). Liver Function Tests:  Recent Labs Lab 12/09/16 0907 12/09/16 2111 12/10/16 0413  AST 44* 44* 34  ALT 16 22 19   ALKPHOS 42 36* 30*  BILITOT 0.6 0.6 0.9  PROT 5.3* 5.3* 4.7*  ALBUMIN 2.8* 2.8* 2.4*    Recent Labs Lab 12/09/16 2111  LIPASE 17   No results for input(s): AMMONIA in the last 168 hours. Coagulation Profile:  Recent Labs Lab 12/09/16 0907 12/09/16 2111  INR 1.24 1.26   Cardiac Enzymes:  Recent Labs Lab 12/09/16 0907 12/09/16 2111 12/10/16 0413  CKTOTAL  --  186  --   TROPONINI 0.09* 0.78* 0.44*   BNP (last 3 results) No results for input(s): PROBNP in the last 8760 hours. HbA1C: No results for input(s): HGBA1C in the last 72 hours. CBG: No results for input(s): GLUCAP in the last 168 hours. Lipid Profile: No results for input(s): CHOL, HDL, LDLCALC, TRIG, CHOLHDL, LDLDIRECT in the last 72 hours. Thyroid Function Tests: No results for input(s): TSH, T4TOTAL, FREET4, T3FREE, THYROIDAB in the last 72 hours. Anemia Panel:  Recent Labs  12/09/16 2111  VITAMINB12 484  FOLATE 9.7  FERRITIN 58  TIBC 260  IRON 15*  RETICCTPCT 2.4   Urine analysis:    Component Value Date/Time   COLORURINE YELLOW 12/09/2016 0841     APPEARANCEUR CLEAR 12/09/2016 0841   LABSPEC 1.035 (H) 12/09/2016 0841   PHURINE 5.0 12/09/2016 0841   GLUCOSEU NEGATIVE 12/09/2016 0841   HGBUR MODERATE (A) 12/09/2016 0841   BILIRUBINUR NEGATIVE 12/09/2016 0841   Lavenia Atlas  NEGATIVE 12/09/2016 0841   PROTEINUR NEGATIVE 12/09/2016 0841   NITRITE NEGATIVE 12/09/2016 0841   LEUKOCYTESUR NEGATIVE 12/09/2016 0841   Sepsis Labs: @LABRCNTIP (procalcitonin:4,lacticidven:4) ) Recent Results (from the past 240 hour(s))  Culture, blood (routine x 2)     Status: None (Preliminary result)   Collection Time: 12/09/16  9:00 AM  Result Value Ref Range Status   Specimen Description BLOOD LEFT FOREARM  Final   Special Requests   Final    BOTTLES DRAWN AEROBIC AND ANAEROBIC Blood Culture adequate volume   Culture NO GROWTH 3 DAYS  Final   Report Status PENDING  Incomplete  Urine culture     Status: None   Collection Time: 12/09/16  2:29 PM  Result Value Ref Range Status   Specimen Description URINE, CATHETERIZED  Final   Special Requests NONE  Final   Culture NO GROWTH  Final   Report Status 12/11/2016 FINAL  Final  MRSA PCR Screening     Status: None   Collection Time: 12/09/16  8:48 PM  Result Value Ref Range Status   MRSA by PCR NEGATIVE NEGATIVE Final    Comment:        The GeneXpert MRSA Assay (FDA approved for NASAL specimens only), is one component of a comprehensive MRSA colonization surveillance program. It is not intended to diagnose MRSA infection nor to guide or monitor treatment for MRSA infections.   Culture, blood (Routine X 2) w Reflex to ID Panel     Status: None (Preliminary result)   Collection Time: 12/09/16  9:11 PM  Result Value Ref Range Status   Specimen Description BLOOD RIGHT ANTECUBITAL  Final   Special Requests   Final    BOTTLES DRAWN AEROBIC ONLY Blood Culture adequate volume   Culture NO GROWTH 2 DAYS  Final   Report Status PENDING  Incomplete  Gastrointestinal Panel by PCR , Stool     Status: None    Collection Time: 12/10/16  4:48 AM  Result Value Ref Range Status   Campylobacter species NOT DETECTED NOT DETECTED Final   Plesimonas shigelloides NOT DETECTED NOT DETECTED Final   Salmonella species NOT DETECTED NOT DETECTED Final   Yersinia enterocolitica NOT DETECTED NOT DETECTED Final   Vibrio species NOT DETECTED NOT DETECTED Final   Vibrio cholerae NOT DETECTED NOT DETECTED Final   Enteroaggregative E coli (EAEC) NOT DETECTED NOT DETECTED Final   Enteropathogenic E coli (EPEC) NOT DETECTED NOT DETECTED Final   Enterotoxigenic E coli (ETEC) NOT DETECTED NOT DETECTED Final   Shiga like toxin producing E coli (STEC) NOT DETECTED NOT DETECTED Final   Shigella/Enteroinvasive E coli (EIEC) NOT DETECTED NOT DETECTED Final   Cryptosporidium NOT DETECTED NOT DETECTED Final   Cyclospora cayetanensis NOT DETECTED NOT DETECTED Final   Entamoeba histolytica NOT DETECTED NOT DETECTED Final   Giardia lamblia NOT DETECTED NOT DETECTED Final   Adenovirus F40/41 NOT DETECTED NOT DETECTED Final   Astrovirus NOT DETECTED NOT DETECTED Final   Norovirus GI/GII NOT DETECTED NOT DETECTED Final   Rotavirus A NOT DETECTED NOT DETECTED Final   Sapovirus (I, II, IV, and V) NOT DETECTED NOT DETECTED Final         Radiology Studies: No results found.    Scheduled Meds: . chlorhexidine  15 mL Mouth Rinse BID  . diltiazem  30 mg Oral Q12H  . mouth rinse  15 mL Mouth Rinse q12n4p  . ondansetron (ZOFRAN) IV  4 mg Intravenous Once  . pantoprazole (PROTONIX) IV  40  mg Intravenous Q12H   Continuous Infusions: . sodium chloride 10 mL/hr at 12/10/16 0700     LOS: 3 days    Time spent in minutes: 35    Calvert Cantor, MD [facility] Pager: [email or web address] Password [email or web address] 12/12/2016, 3:00 PM

## 2016-12-12 NOTE — Evaluation (Signed)
Physical Therapy Evaluation Patient Details Name: Kelly Ramirez MRN: [id number] DOB: 20-Nov-1920 Today's Date: 12/12/2016   History of Present Illness  83 year old female with history of hypertension, hyperlipidemia,peptic ulcer disease, arthritis brought in by EMS for altered mental status,Urinary and bowel incontinence and melena. In the ER patient with atrial fibrillation with RVR. Admitted by PCCM hemodynamic instability and clinical decline. Patient was transferred to [facility] on 12/11/2016.  Clinical Impression  Pt was able to be seen to work on bed mob, transfers and transition to chair with dense cues for safety and to perform the basic skills of balancing and gait.  Pt may not be familiar with walker and this could be her struggle but with the density of effort it required to walk with her, she will need to be admitted to SNF for recovery back to home.  Would advise the family to consider her ability to live alone as she is more fragile and may not be safely able to go directly home from inpt care.  Follow acutely for strength and balance of gait.    Follow Up Recommendations SNF    Equipment Recommendations  None recommended by PT    Recommendations for Other Services       Precautions / Restrictions Precautions Precautions: Fall Restrictions Weight Bearing Restrictions: No      Mobility  Bed Mobility Overal bed mobility: Needs Assistance Bed Mobility: Supine to Sit     Supine to sit: Mod assist;HOB elevated     General bed mobility comments: to assist trunk then used bed pad to complete transitino to EOB  Transfers Overall transfer level: Needs assistance Equipment used: Rolling walker (2 wheeled) Transfers: Sit to/from Stand Sit to Stand: Mod assist;+2 physical assistance;+2 safety/equipment;From elevated surface Stand pivot transfers: Mod assist;+2 safety/equipment       General transfer comment: dense verbal and tactile cues even for individual steps of each  foot  Ambulation/Gait Ambulation/Gait assistance: Min assist;+2 physical assistance;+2 safety/equipment Ambulation Distance (Feet): 4 Feet Assistive device: Rolling walker (2 wheeled);2 person hand held assist Gait Pattern/deviations: Step-to pattern;Decreased stride length;Shuffle;Wide base of support;Trunk flexed Gait velocity: reduced Gait velocity interpretation: Below normal speed for age/gender General Gait Details: pt needed specific cues for all directions and may only need hands on help for gait due to walker being confusing for her  Stairs            Wheelchair Mobility    Modified Rankin (Stroke Patients Only)       Balance Overall balance assessment: Needs assistance Sitting-balance support: Feet supported;Bilateral upper extremity supported Sitting balance-Leahy Scale: Fair Sitting balance - Comments: fair once set Postural control: Posterior lean Standing balance support: Bilateral upper extremity supported;During functional activity Standing balance-Leahy Scale: Poor Standing balance comment: requires both cues and RW as support                             Pertinent Vitals/Pain Pain Assessment: No/denies pain    Home Living Family/patient expects to be discharged to:: Private residence Living Arrangements: Alone Available Help at Discharge: Available PRN/intermittently (has a stepson who checks in on her) Type of Home: Apartment Home Access: Stairs to enter Entrance Stairs-Rails: Right Entrance Stairs-Number of Steps: 2 Home Layout: One level Home Equipment: Emergency planning/management officer - 2 wheels;Bedside commode      Prior Function Level of Independence: Needs assistance   Gait / Transfers Assistance Needed: used a RW to get around  ADL's /  Homemaking Assistance Needed: personal care attendant for some self care        Hand Dominance        Extremity/Trunk Assessment   Upper Extremity Assessment Upper Extremity Assessment:  Generalized weakness    Lower Extremity Assessment Lower Extremity Assessment: Generalized weakness    Cervical / Trunk Assessment Cervical / Trunk Assessment: Kyphotic  Communication   Communication: No difficulties  Cognition Arousal/Alertness: Awake/alert Behavior During Therapy: WFL for tasks assessed/performed Overall Cognitive Status: No family/caregiver present to determine baseline cognitive functioning                                 General Comments: Pt seemed WFL - no blaring deficits      General Comments General comments (skin integrity, edema, etc.): Transfusion ongoing with no issues during tx    Exercises     Assessment/Plan    PT Assessment Patient needs continued PT services  PT Problem List Decreased strength;Decreased range of motion;Decreased activity tolerance;Decreased balance;Decreased mobility;Decreased coordination;Decreased cognition;Decreased knowledge of use of DME;Decreased safety awareness;Decreased knowledge of precautions;Cardiopulmonary status limiting activity       PT Treatment Interventions DME instruction;Gait training;Functional mobility training;Therapeutic activities;Balance training;Therapeutic exercise;Neuromuscular re-education;Patient/family education    PT Goals (Current goals can be found in the Care Plan section)  Acute Rehab PT Goals Patient Stated Goal: To get stronger again PT Goal Formulation: With patient Time For Goal Achievement: 12/26/16 Potential to Achieve Goals: Fair    Frequency Min 2X/week   Barriers to discharge Decreased caregiver support;Inaccessible home environment home alone in staired environment    Co-evaluation PT/OT/SLP Co-Evaluation/Treatment: Yes Reason for Co-Treatment: Complexity of the patient's impairments (multi-system involvement);For patient/therapist safety;To address functional/ADL transfers PT goals addressed during session: Mobility/safety with mobility;Balance;Proper  use of DME OT goals addressed during session: ADL's and self-care       AM-PAC PT "6 Clicks" Daily Activity  Outcome Measure Difficulty turning over in bed (including adjusting bedclothes, sheets and blankets)?: Unable Difficulty moving from lying on back to sitting on the side of the bed? : Unable Difficulty sitting down on and standing up from a chair with arms (e.g., wheelchair, bedside commode, etc,.)?: Unable Help needed moving to and from a bed to chair (including a wheelchair)?: A Lot Help needed walking in hospital room?: A Lot Help needed climbing 3-5 steps with a railing? : Total 6 Click Score: 8    End of Session Equipment Utilized During Treatment: Gait belt Activity Tolerance: Patient tolerated treatment well;Patient limited by fatigue;Other (comment) (dementia may be affecting quality of gait) Patient left: in chair;with call bell/phone within reach;with chair alarm set;with nursing/sitter in room Nurse Communication: Mobility status;Other (comment) (purwick is off) PT Visit Diagnosis: Unsteadiness on feet (R26.81);Muscle weakness (generalized) (M62.81);Difficulty in walking, not elsewhere classified (R26.2);Adult, failure to thrive (R62.7)    Time: 1010-1049 PT Time Calculation (min) (ACUTE ONLY): 39 min   Charges:   PT Evaluation $PT Eval Moderate Complexity: 1 Mod     PT G Codes:   PT G-Codes **NOT FOR INPATIENT CLASS** Functional Assessment Tool Used: AM-PAC 6 Clicks Basic Mobility    Ivar DrapeRuth E Yoceline Bazar 12/12/2016, 5:06 PM   Samul Dadauth Odeal Welden, PT MS Acute Rehab Dept. Number: [facility] [phone] and [facility] [phone]

## 2016-12-12 NOTE — Evaluation (Signed)
Occupational Therapy Evaluation Patient Details Name: Kelly Ramirez MRN: [id number] DOB: Aug 10, 1920 Today's Date: 12/12/2016    History of Present Illness 83 year old female with history of hypertension, hyperlipidemia,peptic ulcer disease, arthritis brought in by EMS for altered mental status,Urinary and bowel incontinence and melena. In the ER patient with atrial fibrillation with RVR. Admitted by PCCM hemodynamic instability and clinical decline. [facility].   Clinical Impression   PTA Pt living alone and had assist for ADL and IADL from PCA, uses a RW to get around. Pt shared she's been falling a lot recent. Pt is currently mod A for ADL and mod A for stand pivot transfer with RW. Please see OT problem list below. Pt will require skilled OT in the acute setting and afterwards at the SNF level to maximize safety and independence in ADL and functional transfers. Next session to focus on sitting balance for grooming activities and increased independence in functional transfers.     Follow Up Recommendations  SNF;Supervision/Assistance - 24 hour    Equipment Recommendations  None recommended by OT (Pt has appropriate DME)    Recommendations for Other Services       Precautions / Restrictions Precautions Precautions: Fall Restrictions Weight Bearing Restrictions: No      Mobility Bed Mobility Overal bed mobility: Needs Assistance Bed Mobility: Supine to Sit     Supine to sit: Mod assist     General bed mobility comments: mod A for trunk elevation, increased time required  Transfers Overall transfer level: Needs assistance Equipment used: Rolling walker (2 wheeled) Transfers: Sit to/from [employer];Stand Pivot Transfers Sit to Stand: Mod assist;+2 safety/equipment Stand pivot transfers: Mod assist;+2 safety/equipment       General transfer comment: +2 assist for lines only, Pt requires mulitmodal step by step cues for sequencing and safety  with RW    Balance Overall balance assessment: Needs assistance Sitting-balance support: Bilateral upper extremity supported;Feet supported Sitting balance-Leahy Scale: Fair Sitting balance - Comments: initially required mod A but able to progress to min guard   Standing balance support: Bilateral upper extremity supported;During functional activity Standing balance-Leahy Scale: Poor Standing balance comment: reliant on BUE support                           ADL either performed or assessed with clinical judgement   ADL Overall ADL's : Needs assistance/impaired Eating/Feeding: Set up   Grooming: Wash/dry hands;Wash/dry face;Set up;Sitting Grooming Details (indicate cue type and reason): in recliner Upper Body Bathing: Moderate assistance   Lower Body Bathing: Maximal assistance   Upper Body Dressing : Minimal assistance   Lower Body Dressing: Maximal assistance   Toilet Transfer: Moderate assistance;Cueing for safety;Cueing for sequencing;Stand-pivot;Comfort height toilet;RW Toilet Transfer Details (indicate cue type and reason): multimodal cues, Pt requires extra time, assist for power up, continuous verbal cues for safety and sequencing Toileting- Clothing Manipulation and Hygiene: Sit to/from stand;Moderate assistance       Functional mobility during ADLs: Moderate assistance;+2 for safety/equipment;Cueing for sequencing;Cueing for safety;Rolling walker (stand pivot only this session)       Vision Baseline Vision/History: Legally blind;Cataracts (blind in R eye, cataracts in L) Patient Visual Report: No change from baseline       Perception     Praxis      Pertinent Vitals/Pain Pain Assessment: No/denies pain     Hand Dominance     Extremity/Trunk Assessment Upper Extremity Assessment Upper Extremity Assessment: Generalized  weakness   Lower Extremity Assessment Lower Extremity Assessment: Defer to PT evaluation   Cervical / Trunk  Assessment Cervical / Trunk Assessment: Kyphotic   Communication Communication Communication: No difficulties   Cognition Arousal/Alertness: Awake/alert Behavior During Therapy: WFL for tasks assessed/performed Overall Cognitive Status: No family/caregiver present to determine baseline cognitive functioning                                 General Comments: Pt seemed WFL - no blaring deficits   General Comments  Pt getting blood during session    Exercises     Shoulder Instructions      Home Living Family/patient expects to be discharged to:: Private residence Living Arrangements: Alone Available Help at Discharge: Available PRN/intermittently Type of Home: Apartment Home Access: Stairs to enter [employer] of Steps: 2 Entrance Stairs-Rails: Right Home Layout: One level     Bathroom Shower/Tub: Chief Strategy Officer: Standard Bathroom Accessibility: Yes How Accessible: Accessible via walker Home Equipment: Shower seat;Walker - 2 wheels;Bedside commode          Prior Functioning/Environment Level of Independence: Needs assistance  Gait / Transfers Assistance Needed: used a RW to get around ADL's / Homemaking Assistance Needed: assist for LB dressing, PCA for IADL and will assist with ADL            OT Problem List: Decreased strength;Decreased activity tolerance;Impaired balance (sitting and/or standing);Decreased knowledge of use of DME or AE;Decreased safety awareness      OT Treatment/Interventions: Self-care/ADL training;Energy conservation;DME and/or AE instruction;Therapeutic activities;Patient/family education;Balance training    OT Goals(Current goals can be found in the care plan section) Acute Rehab OT Goals Patient Stated Goal: To get stronger again OT Goal Formulation: With patient Time For Goal Achievement: 12/26/16 Potential to Achieve Goals: Fair ADL Goals Pt Will Perform Grooming: with set-up;sitting Pt  Will Perform Upper Body Bathing: with set-up;sitting Pt Will Perform Lower Body Bathing: with set-up;sitting/lateral leans;with adaptive equipment Pt Will Transfer to Toilet: with min guard assist;stand pivot transfer (with RW) Pt Will Perform Toileting - Clothing Manipulation and hygiene: with min assist;sit to/from stand  OT Frequency: Min 2X/week   Barriers to D/C: Decreased caregiver support          Co-evaluation PT/OT/SLP Co-Evaluation/Treatment: Yes Reason for Co-Treatment: Necessary to address cognition/behavior during functional activity;For patient/therapist safety;To address functional/ADL transfers   OT goals addressed during session: ADL's and self-care      AM-PAC PT "6 Clicks" Daily Activity     Outcome Measure Help from another person eating meals?: A Little Help from another person taking care of personal grooming?: A Little Help from another person toileting, which includes using toliet, bedpan, or urinal?: A Lot Help from another person bathing (including washing, rinsing, drying)?: A Lot Help from another person to put on and taking off regular upper body clothing?: A Little Help from another person to put on and taking off regular lower body clothing?: A Lot 6 Click Score: 15   End of Session Equipment Utilized During Treatment: Gait belt;Rolling walker Nurse Communication: Mobility status (no purewick in place currently)  Activity Tolerance: Patient tolerated treatment well Patient left: in chair;with call bell/phone within reach;with chair alarm set (double checked Pt knew how to use call bell)  OT Visit Diagnosis: Unsteadiness on feet (R26.81);Other abnormalities of gait and mobility (R26.89);Repeated falls (R29.6);History of falling (Z91.81);Adult, failure to thrive (R62.7)  Time: 1020-1053 OT Time Calculation (min): 33 min Charges:  OT General Charges $OT Visit: 1 Visit OT Evaluation $OT Eval Moderate Complexity: 1 Mod G-Codes:      Sherryl Manges OTR/L [phone]  Evern Bio Aadarsh Cozort 12/12/2016, 2:07 PM

## 2016-12-13 ENCOUNTER — Inpatient Hospital Stay (HOSPITAL_COMMUNITY): Payer: Medicare Other

## 2016-12-13 DIAGNOSIS — I340 Nonrheumatic mitral (valve) insufficiency: Secondary | ICD-10-CM

## 2016-12-13 DIAGNOSIS — I361 Nonrheumatic tricuspid (valve) insufficiency: Secondary | ICD-10-CM

## 2016-12-13 LAB — TYPE AND SCREEN
ABO/RH(D): O POS
ANTIBODY SCREEN: NEGATIVE
UNIT DIVISION: 0
UNIT DIVISION: 0
UNIT DIVISION: 0

## 2016-12-13 LAB — BPAM RBC
BLOOD PRODUCT EXPIRATION DATE: 201810212359
BLOOD PRODUCT EXPIRATION DATE: 201811082359
Blood Product Expiration Date: 201811082359
ISSUE DATE / TIME: 201810141133
ISSUE DATE / TIME: 201810141743
ISSUE DATE / TIME: 201810170900
Unit Type and Rh: 5100
Unit Type and Rh: 5100
Unit Type and Rh: 9500

## 2016-12-13 LAB — CBC
HEMATOCRIT: 31.6 % — AB (ref 36.0–46.0)
Hemoglobin: 10.4 g/dL — ABNORMAL LOW (ref 12.0–15.0)
MCH: 31 pg (ref 26.0–34.0)
MCHC: 32.9 g/dL (ref 30.0–36.0)
MCV: 94 fL (ref 78.0–100.0)
PLATELETS: 193 10*3/uL (ref 150–400)
RBC: 3.36 MIL/uL — ABNORMAL LOW (ref 3.87–5.11)
RDW: 15.9 % — AB (ref 11.5–15.5)
WBC: 12.3 10*3/uL — AB (ref 4.0–10.5)

## 2016-12-13 LAB — ECHOCARDIOGRAM COMPLETE: Weight: 2165.8 oz

## 2016-12-13 MED ORDER — PANTOPRAZOLE SODIUM 40 MG PO TBEC
40.0000 mg | DELAYED_RELEASE_TABLET | Freq: Two times a day (BID) | ORAL | Status: DC
  Administered 2016-12-13 – 2016-12-14 (×3): 40 mg via ORAL
  Filled 2016-12-13 (×4): qty 1

## 2016-12-13 NOTE — Progress Notes (Signed)
[facility] Progress Note    Since last GI note: Demented, eating BF in bed. RN reports no overt bleeding yesterday or overnight.  She tolerated one unit blood transfusion with very good bump in hb  Objective: Vital signs in last 24 hours: Temp:  [98.3 F (36.8 C)-98.8 F (37.1 C)] 98.3 F (36.8 C) (10/17 2131) Pulse Rate:  [101-115] 101 (10/17 2131) Resp:  [16-19] 19 (10/17 2131) BP: (111-120)/(66-76) 115/66 (10/18 0900) SpO2:  [98 %-99 %] 99 % (10/17 2131) Weight:  [135 lb 5.8 oz (61.4 kg)] 135 lb 5.8 oz (61.4 kg) (10/17 2131) Last BM Date: 12/10/16 General: alert and oriented times 2 Heart: regular rate and rythm Abdomen: soft, non-tender, non-distended, normal bowel sounds   Lab Results:  Recent Labs  12/11/16 0809 12/12/16 0255 12/13/16 0215  WBC 11.5* 12.3* 12.3*  HGB 8.2* 7.9* 10.4*  PLT 158 175 193  MCV 93.5 94.1 94.0    Recent Labs  12/10/16 1416 12/11/16 0809 12/12/16 0255  NA 141 141 141  K 3.6 3.2* 4.2  CL 110 111 111  CO2 23 25 25   GLUCOSE 109* 96 106*  BUN 35* 27* 25*  CREATININE 0.98 0.90 0.83  CALCIUM 7.9* 8.1* 8.3*    Medications: Scheduled Meds: . chlorhexidine  15 mL Mouth Rinse BID  . gabapentin  100 mg Oral BID  . mouth rinse  15 mL Mouth Rinse q12n4p  . ondansetron (ZOFRAN) IV  4 mg Intravenous Once  . oxybutynin  5 mg Oral BID  . pantoprazole (PROTONIX) IV  40 mg Intravenous Q12H  . verapamil  240 mg Oral Daily   Continuous Infusions: . sodium chloride 10 mL/hr at 12/10/16 0700   PRN Meds:.metoprolol tartrate, [DISCONTINUED] ondansetron **OR** ondansetron (ZOFRAN) IV    Assessment/Plan: 83 y.o. female  with melena, anemia, dementia  EGD Sunday showed large HH and bleeding esophagitis. This is the presumed source of her melena, anemia. Presenting Hb was 7.2, went up to 10.2 after two units and drifted to 7.9 yesterday. One unit transfusion brought it back to 10.4.   No overt bleeding per RN.  OK to change  to oral PPI twice daily and d/c tomorrow if still no clinical signs of rebleeding. She should remain on PPI twice daily for 2months and then it is ok that she back down to once daily PPI (indefinitely).  Please call or page with any further questions or concerns.   Rachael FeeJacobs, Courvoisier Hamblen P, MD  12/13/2016, 9:07 AM [facility] Pager [phone]

## 2016-12-13 NOTE — Progress Notes (Signed)
  Echocardiogram 2D Echocardiogram has been performed.  Kelly Ramirez, Kelly Ramirez 12/13/2016, 10:52 AM

## 2016-12-13 NOTE — Care Management Important Message (Signed)
Important Message  Patient Details  Name: Kelly RushMalinda J Ramirez MRN: [id number] Date of Birth: December 30, 1920   Medicare Important Message Given:  Yes    [name] Abena 12/13/2016, 10:06 AM

## 2016-12-13 NOTE — Progress Notes (Signed)
PROGRESS NOTE    Kelly Ramirez   ONG:[id number]  DOB: 1920-11-29  DOA: 12/09/2016 PCP: Jackie Plum, MD   Brief Narrative:  Kelly Ramirez is a 83 year-old female with a history of esophagitis and chronic NSAID use presented on 10/14 with altered mental status and bloody stools. In ED, pt had significant anemia and continued to have melanic stools. 2 units PRBC's were infused at that time. Pt also had elevated lactic acid and SIRS criteria. There was some initial concern for intra-abdominal infection based on CT findings, and pt was started on IV Zosyn. She was also noted to be in AF with RVR with troponin was elevated which was likely due to demand ischemia. EGD 10/14 showed bleeding esophagitis and large hiatal hernia. Pt was admitted to ICU and stabilized. Transferred to floor 10/15.    Subjective:  No complaints today. ROS: no complaints of nausea, vomiting, constipation diarrhea, cough, dyspnea or dysuria. No other complaints.   Assessment & Plan:    Acute upper GI bleed -  EGD on 10/14 shows large hiatal hernia and bleeding esophagitis  - Pt initially on PPI gtt, now on oral PPI BID - no further bleeding noted- tolerating diet - GI recommendations: follow in hospital until tomorrow, oral PPI BID x 2 months and then daily indefinitely, f/u PRN   Anemia -  10/17 transfused 3rd U PRBC  for HB of 7.6 -Hb 10.6 today   Atrial Fibrillation with RVR - rate controlled on diltiazem when I evaluated her yesterday AM - subseqently became uncontrolled and remained elevated  - takes Verapamil at home which I  resumed at 120mg  rather than 240 mg due to low BP- HR is better controlled now   Acute encephalopathy/ dementia - encephalopathy resolved-     Acute kidney injury - BUN 57, Cr 1.25 on 10/14 -  Improving    Hyperlipidemia - Zocor.  Hypotension with h/o HTN - holding Lisinopril/ HCTZ - transitioned Diltiazem to Verapamil  Anion gap metabolic acidosis -   Resolved. Last AG=5.0.      Urinary incontinence - resumed Ditropan    DVT prophylaxis: SCDs Code Status: Full code Family Communication:  Disposition Plan: SNF recommended Consultants:   GI Procedures:   EGD Antimicrobials:  Anti-infectives    Start     Dose/Rate Route Frequency Ordered Stop   12/09/16 2000  piperacillin-tazobactam (ZOSYN) IVPB 3.375 g  Status:  Discontinued     3.375 g 12.5 mL/hr over 240 Minutes Intravenous Every 8 hours 12/09/16 1625 12/10/16 1045   12/09/16 1200  piperacillin-tazobactam (ZOSYN) IVPB 3.375 g     3.375 g 100 mL/hr over 30 Minutes Intravenous  Once 12/09/16 1159 12/09/16 1337       Objective: Vitals:   12/12/16 1313 12/12/16 2131 12/13/16 0746 12/13/16 0900  BP: 120/66 113/76 118/68 115/66  Pulse: (!) 105 (!) 101  (!) 118  Resp: 16 19  18   Temp: 98.8 F (37.1 C) 98.3 F (36.8 C)    TempSrc: Oral Oral    SpO2: 99% 99%  99%  Weight:  61.4 kg (135 lb 5.8 oz)      Intake/Output Summary (Last 24 hours) at 12/13/16 1516 Last data filed at 12/13/16 0900  Gross per 24 hour  Intake              120 ml  Output              250 ml  Net             -  130 ml   Filed Weights   12/10/16 2232 12/11/16 2319 12/12/16 2131  Weight: 60.3 kg (132 lb 14.4 oz) 61.2 kg (135 lb) 61.4 kg (135 lb 5.8 oz)    Examination: General exam: Appears comfortable  HEENT: PERRLA, oral mucosa moist, no sclera icterus or thrush Respiratory system: Clear to auscultation. Respiratory effort normal. Cardiovascular system: S1 & S2 heard, IIRR.  No murmurs  Gastrointestinal system: Abdomen soft, non-tender, nondistended. Normal bowel sound. No organomegaly Central nervous system: Alert and oriented to person only. No focal neurological deficits. Extremities: No cyanosis, clubbing or edema Skin: No rashes or ulcers Psychiatry:  Mood & affect appropriate.     Data Reviewed: I have personally reviewed following labs and imaging studies  CBC:  Recent  Labs Lab 12/09/16 0907 12/09/16 2111  12/10/16 1416 12/10/16 2320 12/11/16 0809 12/12/16 0255 12/13/16 0215  WBC 11.9* 12.1*  < > 10.6* 9.8 11.5* 12.3* 12.3*  NEUTROABS 9.2* 9.9*  --   --   --   --   --   --   HGB 7.2* 10.2*  < > 8.7* 7.9* 8.2* 7.9* 10.4*  HCT 22.3* 29.8*  < > 25.9* 23.2* 24.6* 23.9* 31.6*  MCV 95.3 91.1  < > 91.5 91.3 93.5 94.1 94.0  PLT 234 186  < > 155 159 158 175 193  < > = values in this interval not displayed. Basic Metabolic Panel:  Recent Labs Lab 12/09/16 2111 12/10/16 0413 12/10/16 1416 12/11/16 0809 12/12/16 0255  NA 137 140 141 141 141  K 2.5* 2.8* 3.6 3.2* 4.2  CL 104 109 110 111 111  CO2 22 21* 23 25 25   GLUCOSE 113* 93 109* 96 106*  BUN 52* 44* 35* 27* 25*  CREATININE 0.98 0.86 0.98 0.90 0.83  CALCIUM 7.8* 7.7* 7.9* 8.1* 8.3*  MG  --  1.4*  --   --  1.9  PHOS  --  2.2*  --   --   --    GFR: CrCl cannot be calculated (Unknown ideal weight.). Liver Function Tests:  Recent Labs Lab 12/09/16 0907 12/09/16 2111 12/10/16 0413  AST 44* 44* 34  ALT 16 22 19   ALKPHOS 42 36* 30*  BILITOT 0.6 0.6 0.9  PROT 5.3* 5.3* 4.7*  ALBUMIN 2.8* 2.8* 2.4*    Recent Labs Lab 12/09/16 2111  LIPASE 17   No results for input(s): AMMONIA in the last 168 hours. Coagulation Profile:  Recent Labs Lab 12/09/16 0907 12/09/16 2111  INR 1.24 1.26   Cardiac Enzymes:  Recent Labs Lab 12/09/16 0907 12/09/16 2111 12/10/16 0413  CKTOTAL  --  186  --   TROPONINI 0.09* 0.78* 0.44*   BNP (last 3 results) No results for input(s): PROBNP in the last 8760 hours. HbA1C: No results for input(s): HGBA1C in the last 72 hours. CBG: No results for input(s): GLUCAP in the last 168 hours. Lipid Profile: No results for input(s): CHOL, HDL, LDLCALC, TRIG, CHOLHDL, LDLDIRECT in the last 72 hours. Thyroid Function Tests: No results for input(s): TSH, T4TOTAL, FREET4, T3FREE, THYROIDAB in the last 72 hours. Anemia Panel: No results for input(s):  VITAMINB12, FOLATE, FERRITIN, TIBC, IRON, RETICCTPCT in the last 72 hours. Urine analysis:    Component Value Date/Time   COLORURINE YELLOW 12/09/2016 0841   APPEARANCEUR CLEAR 12/09/2016 0841   LABSPEC 1.035 (H) 12/09/2016 0841   PHURINE 5.0 12/09/2016 0841   GLUCOSEU NEGATIVE 12/09/2016 0841   HGBUR MODERATE (A) 12/09/2016 0841   BILIRUBINUR NEGATIVE 12/09/2016  0841   KETONESUR NEGATIVE 12/09/2016 0841   PROTEINUR NEGATIVE 12/09/2016 0841   NITRITE NEGATIVE 12/09/2016 0841   LEUKOCYTESUR NEGATIVE 12/09/2016 0841   Sepsis Labs: @LABRCNTIP (procalcitonin:4,lacticidven:4) ) Recent Results (from the past 240 hour(s))  Culture, blood (routine x 2)     Status: None (Preliminary result)   Collection Time: 12/09/16  9:00 AM  Result Value Ref Range Status   Specimen Description BLOOD LEFT FOREARM  Final   Special Requests   Final    BOTTLES DRAWN AEROBIC AND ANAEROBIC Blood Culture adequate volume   Culture NO GROWTH 4 DAYS  Final   Report Status PENDING  Incomplete  Urine culture     Status: None   Collection Time: 12/09/16  2:29 PM  Result Value Ref Range Status   Specimen Description URINE, CATHETERIZED  Final   Special Requests NONE  Final   Culture NO GROWTH  Final   Report Status 12/11/2016 FINAL  Final  MRSA PCR Screening     Status: None   Collection Time: 12/09/16  8:48 PM  Result Value Ref Range Status   MRSA by PCR NEGATIVE NEGATIVE Final    Comment:        The GeneXpert MRSA Assay (FDA approved for NASAL specimens only), is one component of a comprehensive MRSA colonization surveillance program. It is not intended to diagnose MRSA infection nor to guide or monitor treatment for MRSA infections.   Culture, blood (Routine X 2) w Reflex to ID Panel     Status: None (Preliminary result)   Collection Time: 12/09/16  9:11 PM  Result Value Ref Range Status   Specimen Description BLOOD RIGHT ANTECUBITAL  Final   Special Requests   Final    BOTTLES DRAWN AEROBIC ONLY  Blood Culture adequate volume   Culture NO GROWTH 3 DAYS  Final   Report Status PENDING  Incomplete  Gastrointestinal Panel by PCR , Stool     Status: None   Collection Time: 12/10/16  4:48 AM  Result Value Ref Range Status   Campylobacter species NOT DETECTED NOT DETECTED Final   Plesimonas shigelloides NOT DETECTED NOT DETECTED Final   Salmonella species NOT DETECTED NOT DETECTED Final   Yersinia enterocolitica NOT DETECTED NOT DETECTED Final   Vibrio species NOT DETECTED NOT DETECTED Final   Vibrio cholerae NOT DETECTED NOT DETECTED Final   Enteroaggregative E coli (EAEC) NOT DETECTED NOT DETECTED Final   Enteropathogenic E coli (EPEC) NOT DETECTED NOT DETECTED Final   Enterotoxigenic E coli (ETEC) NOT DETECTED NOT DETECTED Final   Shiga like toxin producing E coli (STEC) NOT DETECTED NOT DETECTED Final   Shigella/Enteroinvasive E coli (EIEC) NOT DETECTED NOT DETECTED Final   Cryptosporidium NOT DETECTED NOT DETECTED Final   Cyclospora cayetanensis NOT DETECTED NOT DETECTED Final   Entamoeba histolytica NOT DETECTED NOT DETECTED Final   Giardia lamblia NOT DETECTED NOT DETECTED Final   Adenovirus F40/41 NOT DETECTED NOT DETECTED Final   Astrovirus NOT DETECTED NOT DETECTED Final   Norovirus GI/GII NOT DETECTED NOT DETECTED Final   Rotavirus A NOT DETECTED NOT DETECTED Final   Sapovirus (I, II, IV, and V) NOT DETECTED NOT DETECTED Final         Radiology Studies: No results found.    Scheduled Meds: . chlorhexidine  15 mL Mouth Rinse BID  . gabapentin  100 mg Oral BID  . mouth rinse  15 mL Mouth Rinse q12n4p  . ondansetron (ZOFRAN) IV  4 mg Intravenous Once  . oxybutynin  5 mg Oral BID  . pantoprazole  40 mg Oral BID AC  . verapamil  240 mg Oral Daily   Continuous Infusions: . sodium chloride 10 mL/hr at 12/10/16 0700     LOS: 4 days    Time spent in minutes: 35    Calvert Cantor, MD [facility] Pager: [email or web address] Password [email or web address] 12/13/2016, 3:16  PM

## 2016-12-14 DIAGNOSIS — I10 Essential (primary) hypertension: Secondary | ICD-10-CM

## 2016-12-14 DIAGNOSIS — K449 Diaphragmatic hernia without obstruction or gangrene: Secondary | ICD-10-CM

## 2016-12-14 DIAGNOSIS — E876 Hypokalemia: Secondary | ICD-10-CM

## 2016-12-14 DIAGNOSIS — K209 Esophagitis, unspecified without bleeding: Secondary | ICD-10-CM

## 2016-12-14 DIAGNOSIS — E872 Acidosis: Secondary | ICD-10-CM

## 2016-12-14 LAB — BASIC METABOLIC PANEL
Anion gap: 6 (ref 5–15)
BUN: 21 mg/dL — AB (ref 6–20)
CHLORIDE: 109 mmol/L (ref 101–111)
CO2: 25 mmol/L (ref 22–32)
CREATININE: 0.82 mg/dL (ref 0.44–1.00)
Calcium: 8.2 mg/dL — ABNORMAL LOW (ref 8.9–10.3)
GFR calc Af Amer: >60 mL/min (ref >60)
GFR calc non Af Amer: 60 mL/min — ABNORMAL LOW (ref >60)
Glucose, Bld: 104 mg/dL — ABNORMAL HIGH (ref 65–99)
Potassium: 4 mmol/L (ref 3.5–5.1)
SODIUM: 140 mmol/L (ref 135–145)

## 2016-12-14 LAB — CBC
HCT: 29.5 % — ABNORMAL LOW (ref 36.0–46.0)
HEMOGLOBIN: 9.5 g/dL — AB (ref 12.0–15.0)
MCH: 30.5 pg (ref 26.0–34.0)
MCHC: 32.2 g/dL (ref 30.0–36.0)
MCV: 94.9 fL (ref 78.0–100.0)
Platelets: 185 10*3/uL (ref 150–400)
RBC: 3.11 MIL/uL — ABNORMAL LOW (ref 3.87–5.11)
RDW: 15.6 % — ABNORMAL HIGH (ref 11.5–15.5)
WBC: 10.7 10*3/uL — ABNORMAL HIGH (ref 4.0–10.5)

## 2016-12-14 LAB — CULTURE, BLOOD (ROUTINE X 2)
Culture: NO GROWTH
Special Requests: ADEQUATE

## 2016-12-14 MED ORDER — POLYETHYLENE GLYCOL 3350 17 G PO PACK: 17.0000 g | PACK | Freq: Every day | ORAL | 0 refills | Status: AC | PRN

## 2016-12-14 MED ORDER — ENSURE ENLIVE PO LIQD: 237.0000 mL | Freq: Two times a day (BID) | ORAL | 12 refills | Status: AC

## 2016-12-14 MED ORDER — PANTOPRAZOLE SODIUM 40 MG PO TBEC: 40.0000 mg | DELAYED_RELEASE_TABLET | Freq: Two times a day (BID) | ORAL | Status: AC

## 2016-12-14 MED ORDER — ENSURE ENLIVE PO LIQD: 237.0000 mL | Freq: Two times a day (BID) | ORAL | Status: DC

## 2016-12-14 MED ORDER — SODIUM CHLORIDE 0.9 % IV BOLUS (SEPSIS): 500.0000 mL | Freq: Once | INTRAVENOUS | Status: DC

## 2016-12-14 MED ORDER — SODIUM CHLORIDE 0.9 % IV BOLUS (SEPSIS)
500.0000 mL | Freq: Once | INTRAVENOUS | Status: AC
  Administered 2016-12-14: 500 mL via INTRAVENOUS

## 2016-12-14 MED ORDER — BISACODYL 10 MG RE SUPP
10.0000 mg | Freq: Once | RECTAL | Status: AC
  Administered 2016-12-14: 10 mg via RECTAL
  Filled 2016-12-14: qty 1

## 2016-12-14 NOTE — Progress Notes (Addendum)
Kelly BumpMalinda J Ramirez to be D/C'd Skilled nursing facility per MD order.  Discussed prescriptions and follow up appointments with the patient. Prescriptions given to patient, medication list explained in detail. Pt verbalized understanding.[facility]   Allergies as of 12/14/2016   No Known Allergies     Medication List    STOP taking these medications   HYDROcodone-acetaminophen 5-325 MG tablet Commonly known as:  NORCO/VICODIN   ibuprofen 100 MG tablet Commonly known as:  ADVIL,MOTRIN   lisinopril-hydrochlorothiazide 20-25 MG tablet Commonly known as:  PRINZIDE,ZESTORETIC   PRILOSEC PO   traMADol 50 MG tablet Commonly known as:  ULTRAM     TAKE these medications   feeding supplement (ENSURE ENLIVE) Liqd Take 237 mLs by mouth 2 (two) times daily between meals.   gabapentin 100 MG capsule Commonly known as:  NEURONTIN Take 100 mg by mouth 2 (two) times daily.   lidocaine 5 % Commonly known as:  LIDODERM   multivitamin tablet Take 1 tablet by mouth daily.   oxybutynin 5 MG tablet Commonly known as:  DITROPAN Take 5 mg by mouth 2 (two) times daily.   pantoprazole 40 MG tablet Commonly known as:  PROTONIX Take 1 tablet (40 mg total) by mouth 2 (two) times daily before a meal.   polyethylene glycol packet Commonly known as:  MIRALAX / GLYCOLAX Take 17 g by mouth daily as needed. What changed:  how much to take  how to take this  when to take this  reasons to take this   RA VITAMIN D-3 1000 units tablet Generic drug:  Cholecalciferol Take 1 tablet by mouth daily.   simvastatin 20 MG tablet Commonly known as:  ZOCOR Take 20 mg by mouth daily.   verapamil 240 MG CR tablet Commonly known as:  CALAN-SR Take 240 mg by mouth daily. Verapamil ER 240 mg   VOLTAREN 1 % Gel Generic drug:  diclofenac sodium       Vitals:   12/14/16 0932 12/14/16 1717  BP: 117/72 115/68  Pulse: (!) 116 (!) 112  Resp: 18   Temp: 98.4 F (36.9 C)   SpO2:  96% 98%    Skin clean, dry and intact without evidence of skin break down, no evidence of skin tears noted. IV catheter discontinued intact. Site without signs and symptoms of complications. Dressing and pressure applied. Pt denies pain at this time. No complaints noted.  An After Visit Summary was printed and given to the patient. Patient escorted via [facility], and D/C SNF via [facility]  Kelly RothmanNatalie Lena Gores, RN [facility] Phone [phone]

## 2016-12-14 NOTE — Discharge Summary (Signed)
Physician Discharge Summary  Kelly Ramirez ZOX:[id number] DOB: 1920/11/07 DOA: 12/09/2016  PCP: Jackie Plum, MD  Admit date: 12/09/2016 Discharge date: 12/14/2016  Admitted From: home  Disposition:  SNF   Recommendations for Outpatient Follow-up:  1. F/u HR (A-fib) and adjust Verapamil dose as needed 2. Lisinopril/ HCTZ on hold for now    Discharge Condition:  stable   CODE STATUS:  Full code   Consultations:  GI    Discharge Diagnoses:  Principal Problem:   Acute upper GI bleed Active Problems:   Symptomatic anemia   Acute encephalopathy   HTN (hypertension)   Acute kidney injury (HCC)   HLD (hyperlipidemia)   Metabolic acidosis, increased anion gap   Hypokalemia    Subjective: No complaints.   Brief Summary: Kelly Ramirez is an 83 year-old female with a history of esophagitis and chronic NSAID use presented on 10/14 with altered mental status and bloody stools. In ED, pt had significant anemia and continued to have melanic stools. 2 units PRBC's were infused at that time. Pt also had elevated lactic acid and SIRS criteria. There was some initial concern for intra-abdominal infection based on CT findings, and pt was started on IV Zosyn. She was also noted to be in AF with RVR with troponin was elevated which was likely due to demand ischemia. EGD 10/14 showed bleeding esophagitis and large hiatal hernia. Pt was admitted to ICU and stabilized. Transferred to floor 10/15.   Hospital Course:  Acute upper GI bleed -  EGD on 10/14 shows large hiatal hernia and bleeding esophagitis  - Pt initially on PPI gtt, now on oral PPI BID - no further bleeding noted- tolerating diet - GI recommendations:  oral PPI BID x 2 months and then daily indefinitely, f/u with GI PRN   Anemia - given 2 U PRBC on admission  -  10/17 transfused 3rd U PRBC  for HB of 7.9 - Hb is 9.5 today  Hypotension with h/o HTN - holding Lisinopril/ HCTZ due to low/normal BPs -cont  Verapamil  Hypokalemia - replaced  Anion gap metabolic acidosis -  Resolved. Last AG=5.0.   Atrial Fibrillation with RVR - cont Verapamil  240 mg daily   Acute encephalopathy/ dementia - encephalopathy resolved-     Acute kidney injury - BUN 57, Cr 1.25 on 10/14 -- BUN 21 and Cr 0.82 now   Hyperlipidemia - Zocor.   Urinary incontinence - resumed Ditropan     Discharge Instructions  Discharge Instructions    Increase activity slowly    Complete by:  As directed      Allergies as of 12/14/2016   No Known Allergies     Medication List    STOP taking these medications   HYDROcodone-acetaminophen 5-325 MG tablet Commonly known as:  NORCO/VICODIN   ibuprofen 100 MG tablet Commonly known as:  ADVIL,MOTRIN   lisinopril-hydrochlorothiazide 20-25 MG tablet Commonly known as:  PRINZIDE,ZESTORETIC   PRILOSEC PO   traMADol 50 MG tablet Commonly known as:  ULTRAM     TAKE these medications   feeding supplement (ENSURE ENLIVE) Liqd Take 237 mLs by mouth 2 (two) times daily between meals.   gabapentin 100 MG capsule Commonly known as:  NEURONTIN Take 100 mg by mouth 2 (two) times daily.   lidocaine 5 % Commonly known as:  LIDODERM   multivitamin tablet Take 1 tablet by mouth daily.   oxybutynin 5 MG tablet Commonly known as:  DITROPAN Take 5 mg by mouth 2 (two)  times daily.   pantoprazole 40 MG tablet Commonly known as:  PROTONIX Take 1 tablet (40 mg total) by mouth 2 (two) times daily before a meal.   polyethylene glycol packet Commonly known as:  MIRALAX / GLYCOLAX Take 17 g by mouth daily as needed. What changed:  how much to take  how to take this  when to take this  reasons to take this   RA VITAMIN D-3 1000 units tablet Generic drug:  Cholecalciferol Take 1 tablet by mouth daily.   simvastatin 20 MG tablet Commonly known as:  ZOCOR Take 20 mg by mouth daily.   verapamil 240 MG CR tablet Commonly known as:  CALAN-SR Take  240 mg by mouth daily. Verapamil ER 240 mg   VOLTAREN 1 % Gel Generic drug:  diclofenac sodium       No Known Allergies   Procedures/Studies:  EGD - Significantly dilatation noted throughout the                            length of the esophagus.                           - LA Grade C reflux esophagitis.                           - Large hiatal hernia; large amount of black debris                            in the stomach; no fresh heme noted.                           - Normal duodenal bulb and proximal small bowel.                           - No specimens collected.  Dg Abd 1 View  Result Date: 12/09/2016 CLINICAL DATA:  NG tube placement EXAM: ABDOMEN - 1 VIEW COMPARISON:  None. FINDINGS: NG tube extends into the gastric fundus. LEFT basilar atelectasis. No dilated loops of bowel. IMPRESSION: NG tube in stomach. Electronically Signed   By: Genevive Bi M.D.   On: 12/09/2016 21:10   Ct Abdomen Pelvis W Contrast  Result Date: 12/09/2016 CLINICAL DATA:  Nausea and vomiting with an elevated lactic acid and decreased hemoglobin. Elevated white blood cell count. EXAM: CT ABDOMEN AND PELVIS WITH CONTRAST TECHNIQUE: Multidetector CT imaging of the abdomen and pelvis was performed using the standard protocol following bolus administration of intravenous contrast. CONTRAST:  80 ml ISOVUE-300 IOPAMIDOL (ISOVUE-300) INJECTION 61% COMPARISON:  None. FINDINGS: Lower chest: There is cardiomegaly. No pericardial pleural effusion. Dependent atelectasis is seen in the lung bases. The distal esophagus is dilated and fluid-filled. Hepatobiliary: Multiple stones are identified in the gallbladder. There is pericholecystic fluid. Mild intrahepatic biliary ductal dilatation is seen. The common bile duct is also mildly dilated at 0.8 cm. No focal liver lesion is identified. Pancreas: The pancreas is atrophic. No focal lesion or surrounding inflammatory change. Spleen: Normal in size. Adrenals/Urinary  Tract: The adrenal glands appear normal. The patient has extensive cyst formation in the left kidney. A few small low attenuating lesions in the right kidney are likely cysts. Urinary bladder is unremarkable. Stomach/Bowel: The stomach is fluid-filled.  Hiatal hernia is noted. No evidence of small-bowel obstruction is identified. Prominent gas and stool in the ascending colon and proximal transverse colon are seen. The patient has extensive sigmoid diverticulosis. The appendix is not visualized but no pericecal inflammatory process is seen. No pneumatosis, portal venous gas or free intraperitoneal air is seen. Vascular/Lymphatic: Extensive aortoiliac atherosclerosis is seen. No aneurysm or branch vessel occlusion identified. Reproductive: Status post hysterectomy. No adnexal masses. Other: No fluid collection. Musculoskeletal: The patient has severe multilevel thoracic and lumbar spondylosis. Convex left lumbar scoliosis is noted. No acute abnormality or focal lesion. IMPRESSION: No CT signs of bowel ischemia.  Negative for bowel obstruction. Multiple gallstones with gallbladder wall thickening and/or pericholecystic fluid. If there is concern for cholecystitis, right upper quadrant ultrasound could be used for further evaluation. There is mild intra and extrahepatic biliary ductal dilatation but no common duct stone is identified. Extensive sigmoid diverticulosis without evidence diverticulitis. Fluid filled distal esophagus with a hiatal hernia could be due to reflux and/or poor motility. Gallstones with pericholecystic fluid and gallbladder wall thickening and mild dilatation of the common bile duct. Aortoiliac atherosclerosis without aneurysm. Cardiomegaly. Electronically Signed   By: Drusilla Kanner M.D.   On: 12/09/2016 11:53   Dg Chest Port 1 View  Result Date: 12/09/2016 CLINICAL DATA:  Evaluate for aspiration EXAM: PORTABLE CHEST 1 VIEW COMPARISON:  12/09/2016 FINDINGS: NG tube extends the stomach.  Normal cardiac silhouette with LEFT basilar atelectasis. No effusion. No pulmonary edema. No pneumothorax. IMPRESSION: 1. NG tube extends into stomach. 2. LEFT basilar atelectasis. Electronically Signed   By: Genevive Bi M.D.   On: 12/09/2016 21:08   Dg Chest Port 1 View  Result Date: 12/09/2016 CLINICAL DATA:  Shortness of breath EXAM: PORTABLE CHEST 1 VIEW COMPARISON:  None. FINDINGS: Mild left basilar atelectasis. Right lung is clear. No pleural effusion or pneumothorax. The heart is normal in size. Degenerative changes of the bilateral shoulders. IMPRESSION: No evidence of acute cardiopulmonary disease. Electronically Signed   By: Charline Bills M.D.   On: 12/09/2016 09:03   US Abdomen Limited Ruq  Result Date: 12/09/2016 CLINICAL DATA:  Cholelithiasis on CT of the abdomen EXAM: ULTRASOUND ABDOMEN LIMITED RIGHT UPPER QUADRANT COMPARISON:  None. FINDINGS: Gallbladder: Multiple gallstones within lumen gallbladder. The gallbladder wall is mildly thickened at 4 mm. No pericholecystic fluid. Negative sonographic Murphy's sign. Common bile duct: Diameter: Normal at 5 mm Liver: No focal lesion identified. Within normal limits in parenchymal echogenicity. Portal vein is patent on color Doppler imaging with normal direction of blood flow towards the liver. IMPRESSION: 1. Multiple gallstones fill the lumen of the gallbladder with mild gallbladder wall thickening. No sonographic Murphy's sign. No pericholecystic fluid. 2. Normal common bile duct . Electronically Signed   By: Genevive Bi M.D.   On: 12/09/2016 15:27       Discharge Exam: Vitals:   12/14/16 0646 12/14/16 0932  BP: 126/70 117/72  Pulse: (!) 117 (!) 116  Resp: 18 18  Temp: 98.6 F (37 C) 98.4 F (36.9 C)  SpO2: 98% 96%   Vitals:   12/13/16 1714 12/13/16 2122 12/14/16 0646 12/14/16 0932  BP: 108/69 (!) 111/58 126/70 117/72  Pulse: 70 96 (!) 117 (!) 116  Resp:  16 18 18   Temp:  99.3 F (37.4 C) 98.6 F (37 C) 98.4 F  (36.9 C)  TempSrc:  Oral Oral Oral  SpO2: 98% 100% 98% 96%  Weight:  61.2 kg (135 lb)  General: Pt is alert, awake, not in acute distress Cardiovascular: RRR, S1/S2 +, no rubs, no gallops Respiratory: CTA bilaterally, no wheezing, no rhonchi Abdominal: Soft, NT, ND, bowel sounds + Extremities: no edema, no cyanosis    The results of significant diagnostics from this hospitalization (including imaging, microbiology, ancillary and laboratory) are listed below for reference.     Microbiology: Recent Results (from the past 240 hour(s))  Culture, blood (routine x 2)     Status: None (Preliminary result)   Collection Time: 12/09/16  9:00 AM  Result Value Ref Range Status   Specimen Description BLOOD LEFT FOREARM  Final   Special Requests   Final    BOTTLES DRAWN AEROBIC AND ANAEROBIC Blood Culture adequate volume   Culture NO GROWTH 4 DAYS  Final   Report Status PENDING  Incomplete  Urine culture     Status: None   Collection Time: 12/09/16  2:29 PM  Result Value Ref Range Status   Specimen Description URINE, CATHETERIZED  Final   Special Requests NONE  Final   Culture NO GROWTH  Final   Report Status 12/11/2016 FINAL  Final  MRSA PCR Screening     Status: None   Collection Time: 12/09/16  8:48 PM  Result Value Ref Range Status   MRSA by PCR NEGATIVE NEGATIVE Final    Comment:        The GeneXpert MRSA Assay (FDA approved for NASAL specimens only), is one component of a comprehensive MRSA colonization surveillance program. It is not intended to diagnose MRSA infection nor to guide or monitor treatment for MRSA infections.   Culture, blood (Routine X 2) w Reflex to ID Panel     Status: None (Preliminary result)   Collection Time: 12/09/16  9:11 PM  Result Value Ref Range Status   Specimen Description BLOOD RIGHT ANTECUBITAL  Final   Special Requests   Final    BOTTLES DRAWN AEROBIC ONLY Blood Culture adequate volume   Culture NO GROWTH 3 DAYS  Final   Report  Status PENDING  Incomplete  Gastrointestinal Panel by PCR , Stool     Status: None   Collection Time: 12/10/16  4:48 AM  Result Value Ref Range Status   Campylobacter species NOT DETECTED NOT DETECTED Final   Plesimonas shigelloides NOT DETECTED NOT DETECTED Final   Salmonella species NOT DETECTED NOT DETECTED Final   Yersinia enterocolitica NOT DETECTED NOT DETECTED Final   Vibrio species NOT DETECTED NOT DETECTED Final   Vibrio cholerae NOT DETECTED NOT DETECTED Final   Enteroaggregative E coli (EAEC) NOT DETECTED NOT DETECTED Final   Enteropathogenic E coli (EPEC) NOT DETECTED NOT DETECTED Final   Enterotoxigenic E coli (ETEC) NOT DETECTED NOT DETECTED Final   Shiga like toxin producing E coli (STEC) NOT DETECTED NOT DETECTED Final   Shigella/Enteroinvasive E coli (EIEC) NOT DETECTED NOT DETECTED Final   Cryptosporidium NOT DETECTED NOT DETECTED Final   Cyclospora cayetanensis NOT DETECTED NOT DETECTED Final   Entamoeba histolytica NOT DETECTED NOT DETECTED Final   Giardia lamblia NOT DETECTED NOT DETECTED Final   Adenovirus F40/41 NOT DETECTED NOT DETECTED Final   Astrovirus NOT DETECTED NOT DETECTED Final   Norovirus GI/GII NOT DETECTED NOT DETECTED Final   Rotavirus A NOT DETECTED NOT DETECTED Final   Sapovirus (I, II, IV, and V) NOT DETECTED NOT DETECTED Final     Labs: BNP (last 3 results) No results for input(s): BNP in the last 8760 hours. Basic Metabolic Panel:  Recent Labs  Lab 12/10/16 0413 12/10/16 1416 12/11/16 0809 12/12/16 0255 12/14/16 0352  NA 140 141 141 141 140  K 2.8* 3.6 3.2* 4.2 4.0  CL 109 110 111 111 109  CO2 21* 23 25 25 25   GLUCOSE 93 109* 96 106* 104*  BUN 44* 35* 27* 25* 21*  CREATININE 0.86 0.98 0.90 0.83 0.82  CALCIUM 7.7* 7.9* 8.1* 8.3* 8.2*  MG 1.4*  --   --  1.9  --   PHOS 2.2*  --   --   --   --    Liver Function Tests:  Recent Labs Lab 12/09/16 0907 12/09/16 2111 12/10/16 0413  AST 44* 44* 34  ALT 16 22 19   ALKPHOS 42 36*  30*  BILITOT 0.6 0.6 0.9  PROT 5.3* 5.3* 4.7*  ALBUMIN 2.8* 2.8* 2.4*    Recent Labs Lab 12/09/16 2111  LIPASE 17   No results for input(s): AMMONIA in the last 168 hours. CBC:  Recent Labs Lab 12/09/16 0907 12/09/16 2111  12/10/16 2320 12/11/16 0809 12/12/16 0255 12/13/16 0215 12/14/16 0352  WBC 11.9* 12.1*  < > 9.8 11.5* 12.3* 12.3* 10.7*  NEUTROABS 9.2* 9.9*  --   --   --   --   --   --   HGB 7.2* 10.2*  < > 7.9* 8.2* 7.9* 10.4* 9.5*  HCT 22.3* 29.8*  < > 23.2* 24.6* 23.9* 31.6* 29.5*  MCV 95.3 91.1  < > 91.3 93.5 94.1 94.0 94.9  PLT 234 186  < > 159 158 175 193 185  < > = values in this interval not displayed. Cardiac Enzymes:  Recent Labs Lab 12/09/16 0907 12/09/16 2111 12/10/16 0413  CKTOTAL  --  186  --   TROPONINI 0.09* 0.78* 0.44*   BNP: Invalid input(s): POCBNP CBG: No results for input(s): GLUCAP in the last 168 hours. D-Dimer No results for input(s): DDIMER in the last 72 hours. Hgb A1c No results for input(s): HGBA1C in the last 72 hours. Lipid Profile No results for input(s): CHOL, HDL, LDLCALC, TRIG, CHOLHDL, LDLDIRECT in the last 72 hours. Thyroid function studies No results for input(s): TSH, T4TOTAL, T3FREE, THYROIDAB in the last 72 hours.  Invalid input(s): FREET3 Anemia work up No results for input(s): VITAMINB12, FOLATE, FERRITIN, TIBC, IRON, RETICCTPCT in the last 72 hours. Urinalysis    Component Value Date/Time   COLORURINE YELLOW 12/09/2016 0841   APPEARANCEUR CLEAR 12/09/2016 0841   LABSPEC 1.035 (H) 12/09/2016 0841   PHURINE 5.0 12/09/2016 0841   GLUCOSEU NEGATIVE 12/09/2016 0841   HGBUR MODERATE (A) 12/09/2016 0841   BILIRUBINUR NEGATIVE 12/09/2016 0841   KETONESUR NEGATIVE 12/09/2016 0841   PROTEINUR NEGATIVE 12/09/2016 0841   NITRITE NEGATIVE 12/09/2016 0841   LEUKOCYTESUR NEGATIVE 12/09/2016 0841   Sepsis Labs Invalid input(s): PROCALCITONIN,  WBC,  LACTICIDVEN Microbiology Recent Results (from the past 240  hour(s))  Culture, blood (routine x 2)     Status: None (Preliminary result)   Collection Time: 12/09/16  9:00 AM  Result Value Ref Range Status   Specimen Description BLOOD LEFT FOREARM  Final   Special Requests   Final    BOTTLES DRAWN AEROBIC AND ANAEROBIC Blood Culture adequate volume   Culture NO GROWTH 4 DAYS  Final   Report Status PENDING  Incomplete  Urine culture     Status: None   Collection Time: 12/09/16  2:29 PM  Result Value Ref Range Status   Specimen Description URINE, CATHETERIZED  Final   Special Requests NONE  Final   Culture NO GROWTH  Final   Report Status 12/11/2016 FINAL  Final  MRSA PCR Screening     Status: None   Collection Time: 12/09/16  8:48 PM  Result Value Ref Range Status   MRSA by PCR NEGATIVE NEGATIVE Final    Comment:        The GeneXpert MRSA Assay (FDA approved for NASAL specimens only), is one component of a comprehensive MRSA colonization surveillance program. It is not intended to diagnose MRSA infection nor to guide or monitor treatment for MRSA infections.   Culture, blood (Routine X 2) w Reflex to ID Panel     Status: None (Preliminary result)   Collection Time: 12/09/16  9:11 PM  Result Value Ref Range Status   Specimen Description BLOOD RIGHT ANTECUBITAL  Final   Special Requests   Final    BOTTLES DRAWN AEROBIC ONLY Blood Culture adequate volume   Culture NO GROWTH 3 DAYS  Final   Report Status PENDING  Incomplete  Gastrointestinal Panel by PCR , Stool     Status: None   Collection Time: 12/10/16  4:48 AM  Result Value Ref Range Status   Campylobacter species NOT DETECTED NOT DETECTED Final   Plesimonas shigelloides NOT DETECTED NOT DETECTED Final   Salmonella species NOT DETECTED NOT DETECTED Final   Yersinia enterocolitica NOT DETECTED NOT DETECTED Final   Vibrio species NOT DETECTED NOT DETECTED Final   Vibrio cholerae NOT DETECTED NOT DETECTED Final   Enteroaggregative E coli (EAEC) NOT DETECTED NOT DETECTED Final    Enteropathogenic E coli (EPEC) NOT DETECTED NOT DETECTED Final   Enterotoxigenic E coli (ETEC) NOT DETECTED NOT DETECTED Final   Shiga like toxin producing E coli (STEC) NOT DETECTED NOT DETECTED Final   Shigella/Enteroinvasive E coli (EIEC) NOT DETECTED NOT DETECTED Final   Cryptosporidium NOT DETECTED NOT DETECTED Final   Cyclospora cayetanensis NOT DETECTED NOT DETECTED Final   Entamoeba histolytica NOT DETECTED NOT DETECTED Final   Giardia lamblia NOT DETECTED NOT DETECTED Final   Adenovirus F40/41 NOT DETECTED NOT DETECTED Final   Astrovirus NOT DETECTED NOT DETECTED Final   Norovirus GI/GII NOT DETECTED NOT DETECTED Final   Rotavirus A NOT DETECTED NOT DETECTED Final   Sapovirus (I, II, IV, and V) NOT DETECTED NOT DETECTED Final     Time coordinating discharge: Over 30 minutes  SIGNED:   Calvert CantorSaima Nikayla Madaris, MD  [facility] 12/14/2016, 1:51 PM Pager   If 7PM-7AM, please contact night-coverage [email or web address] Password [email or web address]

## 2016-12-14 NOTE — NC FL2 (Signed)
[place] MEDICAID [id number] LEVEL OF CARE SCREENING TOOL     IDENTIFICATION  Patient Name: Kelly Ramirez Birthdate: 09/24/1920 Sex: female Admission Date (Current Location): 12/09/2016  [place] and [place] Number:  [name] [id number] Q Facility and Address:  [facility], [address] [place], [place] [address]      Provider Number: [id number]  Attending Physician Name and Address:  Calvert Cantor, MD  Relative Name and Phone Number:  Tandy Gaw - "son" - [phone]    Current Level of Care: Hospital Recommended Level of Care: Skilled Nursing Facility Prior Approval Number:    Date Approved/Denied:   PASRR Number: [id number]  Discharge Plan: SNF    Current Diagnoses: Patient Active Problem List   Diagnosis Date Noted  . Hypokalemia   . Acute upper GI bleed 12/09/2016  . Symptomatic anemia 12/09/2016  . Acute encephalopathy 12/09/2016  . HTN (hypertension) 12/09/2016  . Hypothermia 12/09/2016  . Acute kidney injury (HCC) 12/09/2016  . HLD (hyperlipidemia) 12/09/2016  . Metabolic acidosis, increased anion gap 12/09/2016  . UGIB (upper gastrointestinal bleed) 12/09/2016  . High blood pressure   . Dyslipidemia   . History of palpitations     Orientation RESPIRATION BLADDER Height & Weight     Self, Situation  Normal Incontinent, External catheter (External catheter ) Weight: 135 lb (61.2 kg) Height:     BEHAVIORAL SYMPTOMS/MOOD NEUROLOGICAL BOWEL NUTRITION STATUS      Continent Diet (Heart healthy)  AMBULATORY STATUS COMMUNICATION OF NEEDS Skin   Limited Assist Verbally Normal                       Personal Care Assistance Level of Assistance  Bathing, Feeding, Dressing Bathing Assistance: Maximum assistance Feeding assistance: Limited assistance (Assistance with set-up) Dressing Assistance: Maximum assistance (Min assist upper body and max assist lower body)     Functional Limitations Info  Sight, Hearing,  Speech Sight Info: Impaired (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 0.9 %  sodium chloride infusion   Intravenous Continuous Mannam, Praveen, MD 10 mL/hr at 12/10/16 0700    . chlorhexidine (PERIDEX) 0.12 % solution 15 mL  15 mL Mouth Rinse BID Mannam, Praveen, MD   15 mL at 12/14/16 1016  . feeding supplement (ENSURE ENLIVE) (ENSURE ENLIVE) liquid 237 mL  237 mL Oral BID BM Rizwan, Saima, MD      . gabapentin (NEURONTIN) capsule 100 mg  100 mg Oral BID Calvert Cantor, MD   100 mg at 12/14/16 1014  . MEDLINE mouth rinse  15 mL Mouth Rinse q12n4p Mannam, Praveen, MD   15 mL at 12/12/16 1717  . metoprolol tartrate (LOPRESSOR) injection 2.5 mg  2.5 mg Intravenous Q3H PRN Karl Ito, MD   2.5 mg at 12/14/16 1015  . ondansetron (ZOFRAN) injection 4 mg  4 mg Intravenous  Once Jacalyn LefevreHaviland, Julie, MD      . ondansetron [facility](ZOFRAN) injection 4 mg  4 mg Intravenous Q6H PRN Russella DarEllis, Allison L, NP      . oxybutynin (DITROPAN) tablet 5 mg  5 mg Oral BID Calvert Cantorizwan, Saima, MD   5 mg at 12/14/16 1014  . pantoprazole (PROTONIX) EC tablet 40 mg  40 mg Oral BID AC Rachael FeeJacobs, Daniel P, MD   40 mg at 12/14/16 [id number]  . verapamil (CALAN-SR) CR tablet 240 mg  240 mg Oral Daily Calvert Cantorizwan, Saima, MD   240 mg at 12/14/16 1014     Discharge Medications: Please see discharge summary for a list of discharge medications.  Relevant Imaging Results:  Relevant  Lab Results:   Additional Information ss#[id number]  Cristobal GoldmannCrawford, Minami Arriaga Bradley, LCSW

## 2016-12-14 NOTE — Clinical Social Work Note (Signed)
Clinical Social Work Assessment  Patient Details  Name: Kelly Ramirez MRN: [id number] Date of Birth: 1920/10/15  Date of referral:  12/11/16               Reason for consult:  Facility Placement                Permission sought to share information with:  Other Permission granted to share information::  Yes, Verbal Permission Granted  Name::     Kelly Ramirez  Agency::     Relationship::  Son  Contact Information:  [phone]  Housing/Transportation Living arrangements for the past 2 months:  Single Family Home Source of Information:  Adult Children Patient Interpreter Needed:  None Criminal Activity/Legal Involvement Pertinent to Current Situation/Hospitalization:  No - Comment as needed Significant Relationships:  Other(Comment), Adult Children Lives with:  Self Do you feel safe going back to the place where you live?  No (Son and patient in agreement with rehab before returning home) Need for family participation in patient care:  Yes (Comment)  Care giving concerns:  Son reported that patient lives alone and although she has a lot of support at home, short-term rehab will be best to assist her in getting stronger before going home.  Social Worker assessment / plan: CSW talked with patient and her "son" Kelly StallionGeorge at the bedside. Mr. Kelly MaesWaddell reported that he is not patient's natural son, but he is like a son to her and that he looks after patient and handles all of her affairs, financial and otherwise. He added that she also has friends and neighbors who check on her and [facility] comes out 3 days a week to assist with her care.  Patient and son agreeable to short-term rehab and the facility search process was explained and Kelly Ramirez was provided with a SNF list.  [facility].   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  Patient/Family's Understanding of and Emotional Response to Diagnosis, Current Treatment, and Prognosis:  Son is understanding of the reason for patient's admission and the benefits of ST rehab before patient returns home.  Emotional Assessment Appearance:  Appears stated age Attitude/Demeanor/Rapport:  Other (Pleasant) Affect (typically observed):  Appropriate, Pleasant Orientation:  Oriented to Self, Oriented to Situation Alcohol / Substance use:  Never Used Psych involvement (Current and /or in the community):  No (Comment)  Discharge Needs  Concerns to be addressed:  Discharge Planning Concerns Readmission within the last 30 days:  No Current discharge risk:  None Barriers to Discharge:  No Barriers Identified   Cristobal GoldmannCrawford, Day Greb Bradley, LCSW 12/14/2016, 5:53 PM

## 2016-12-14 NOTE — Clinical Social Work Placement (Signed)
   CLINICAL SOCIAL WORK PLACEMENT  NOTE 12/14/16 - DISCHARGED TO [facility]  Date:  12/14/2016  Patient Details  Name: Kelly Ramirez MRN: [id number] Date of Birth: Apr 11, 1920  Clinical Social Work is seeking post-discharge placement for this patient at the Skilled  Nursing Facility level of care (*CSW will initial, date and re-position this form in  chart as items are completed):  Yes   Patient/family provided with [facility] Clinical Social Work Department's list of facilities offering this level of care within the geographic area requested by the patient (or if unable, by the patient's family).  Yes   Patient/family informed of their freedom to choose among providers that offer the needed level of care, that participate in Medicare, Medicaid or managed care program needed by the patient, have an available bed and are willing to accept the patient.  Yes   Patient/family informed of [place]'s ownership interest in [facility] and [facility], as well as of the fact that they are under no obligation to receive care at these facilities.  PASRR submitted to [employer] on 12/14/16     PASRR number received on 12/14/16     Existing PASRR number confirmed on       FL2 transmitted to all facilities in geographic area requested by pt/family on 12/14/16     FL2 transmitted to all facilities within larger geographic area on       Patient informed that his/her managed care company has contracts with or will negotiate with certain facilities, including the following:        Yes   Patient/family informed of bed offers received.  [facility]     Physician recommends and patient chooses bed at      [facility].  Patient to be transferred to facility by Ambulance     Patient family notified on 12/14/16 of transfer.  Name of family member notified:  Lance MussGeorge Waddell - son. Attempted to reach  son at 6:08 to inform him that transport called but no answer and no voice mail. Number provided is a home number.     PHYSICIAN       Additional Comment:    _______________________________________________ Cristobal Goldmannrawford, Dashanae [place]field Bradley, LCSW 12/14/2016, 6:09 PM

## 2016-12-15 LAB — CULTURE, BLOOD (ROUTINE X 2)
CULTURE: NO GROWTH
SPECIAL REQUESTS: ADEQUATE

## 2016-12-24 ENCOUNTER — Ambulatory Visit: Payer: Medicare Other | Admitting: Podiatry

## 2017-03-29 DEATH — deceased

## 2019-03-19 IMAGING — US US ABDOMEN LIMITED
1 series · 14 of 25 positions shown · non-contrast
Comparison: None.

CLINICAL DATA: Cholelithiasis on CT of the abdomen

EXAM:
ULTRASOUND ABDOMEN LIMITED RIGHT UPPER QUADRANT

[Series 1: us abdomen limited · 0.15mm/px · 14 of 41 slices shown]
[im 1/41]
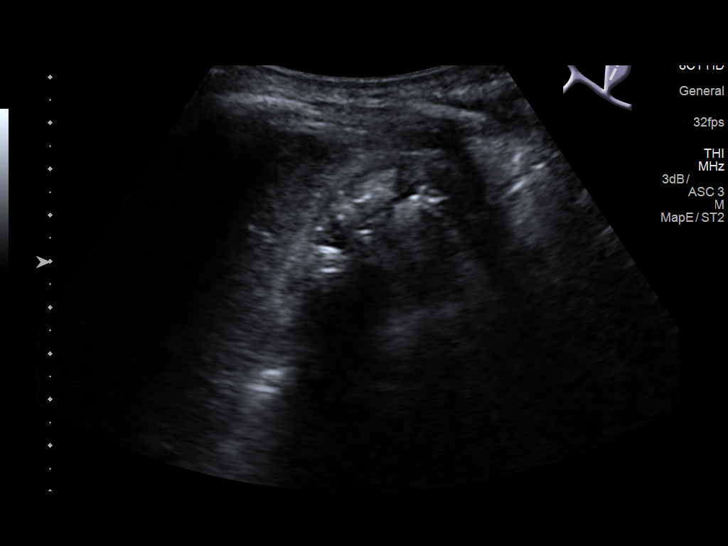
[im 4/41]
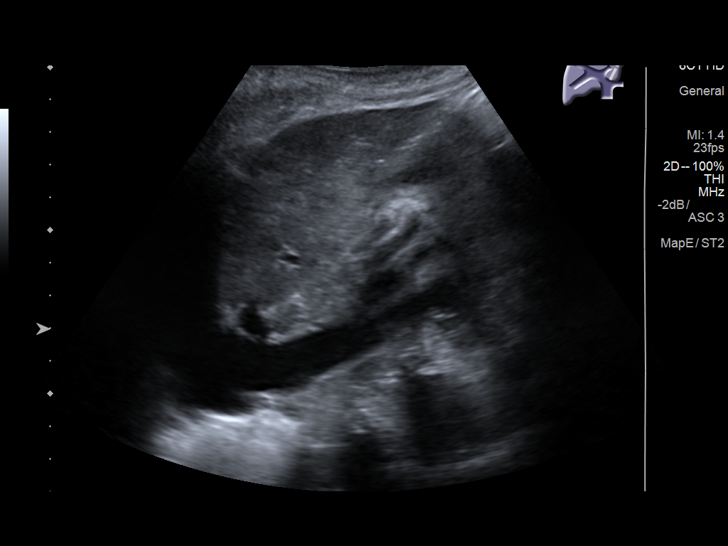
[im 7/41]
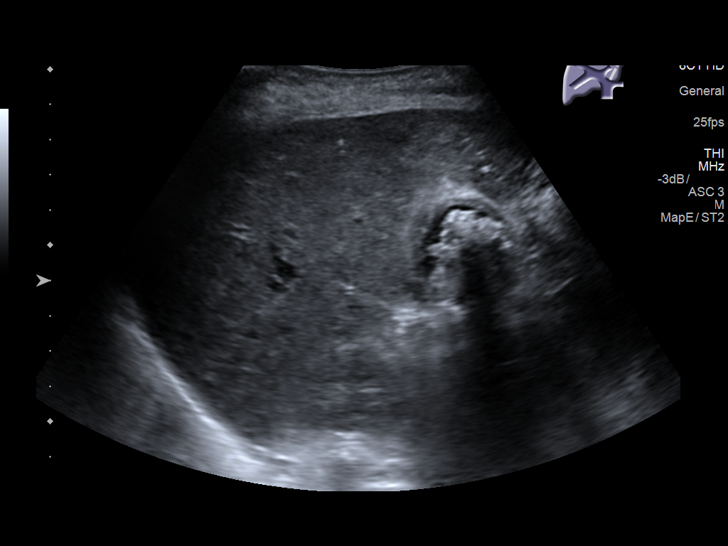
[im 11/41]
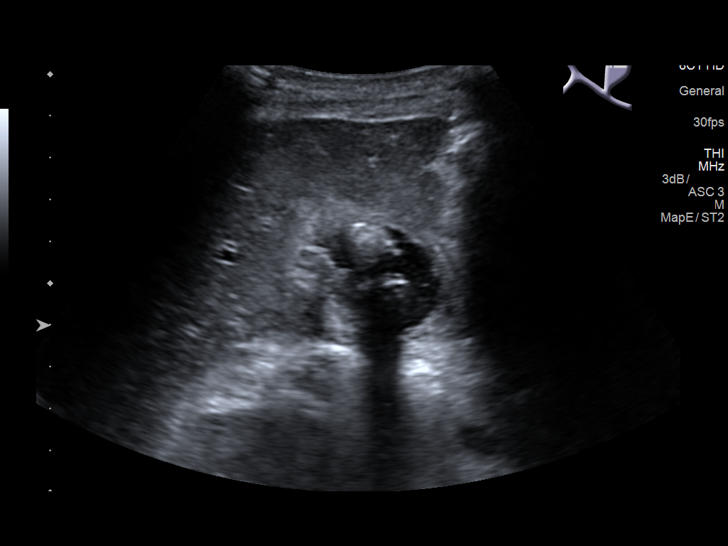
[im 14/41]
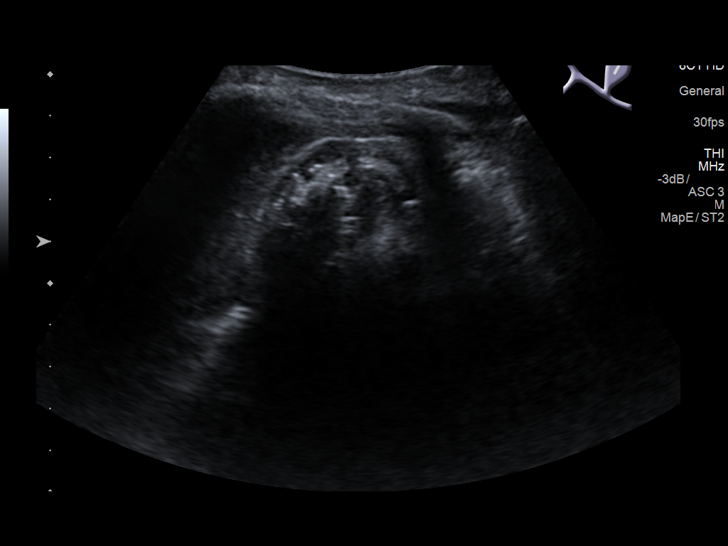
[im 16/41]
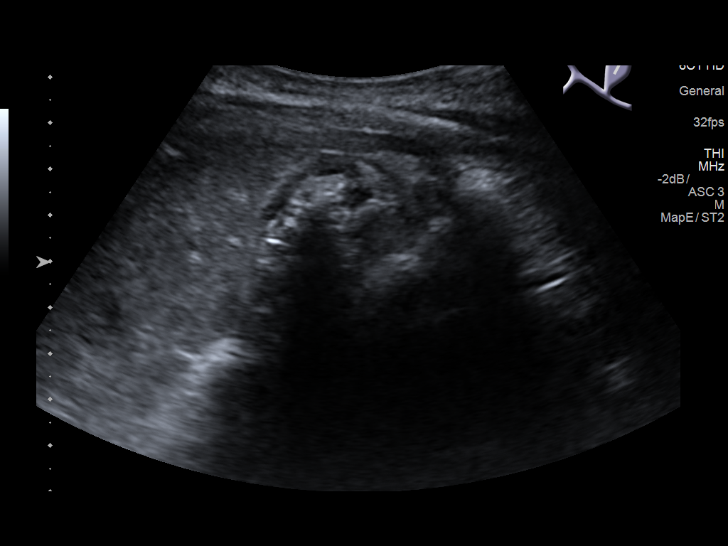
[im 19/41]
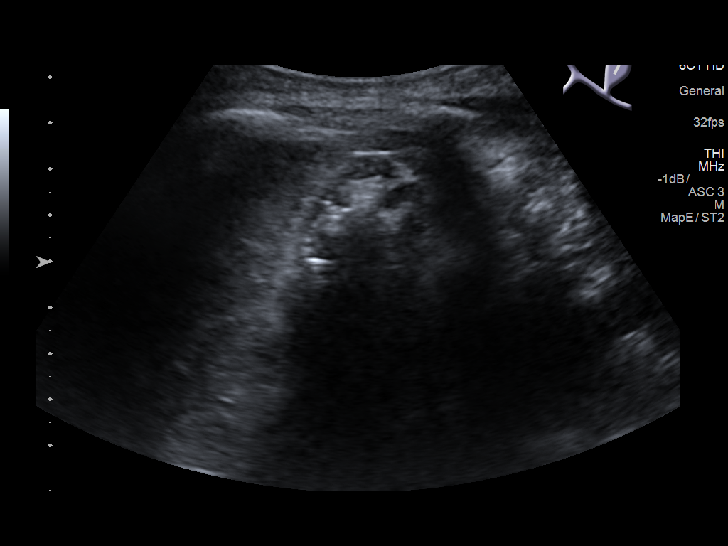
[im 22/41]
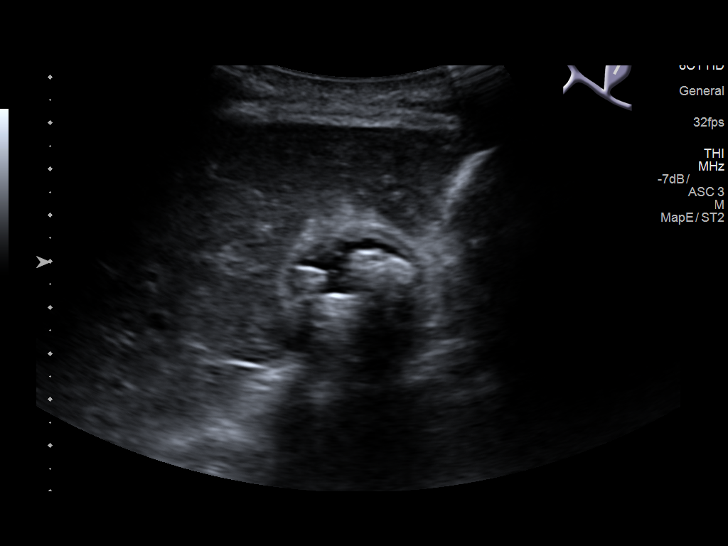
[im 26/41]
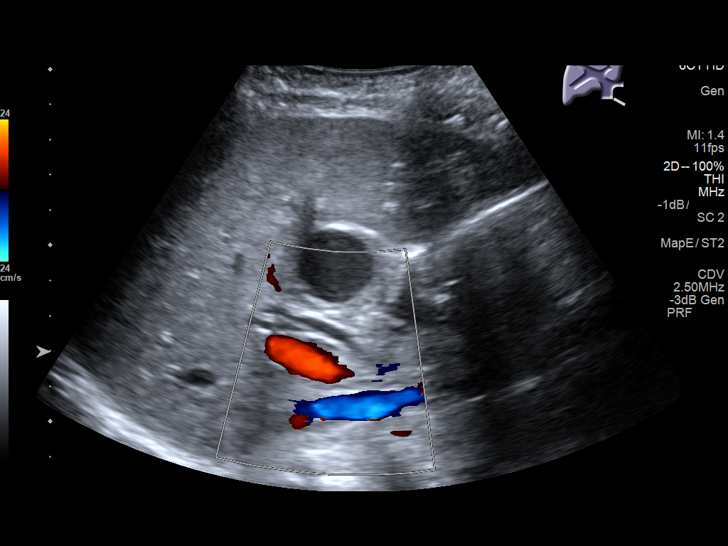
[im 27/41]
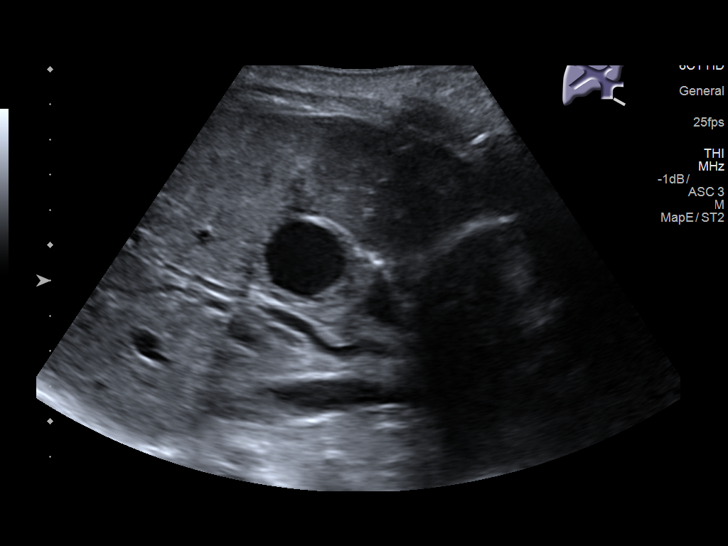
[im 31/41]
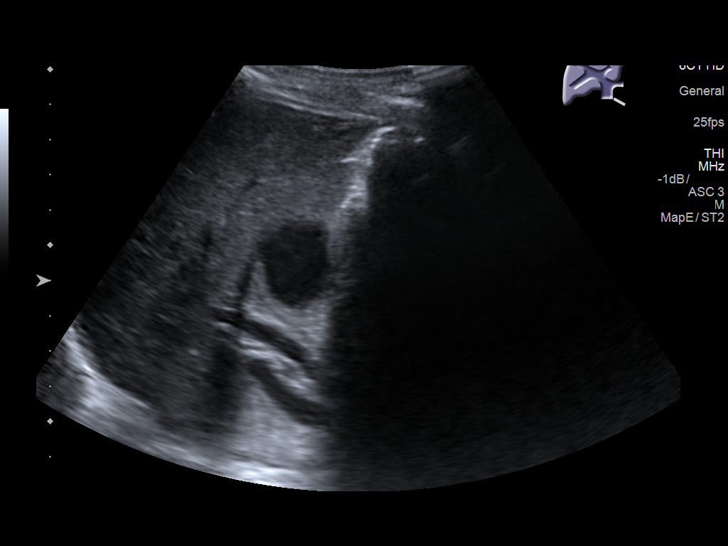
[im 34/41]
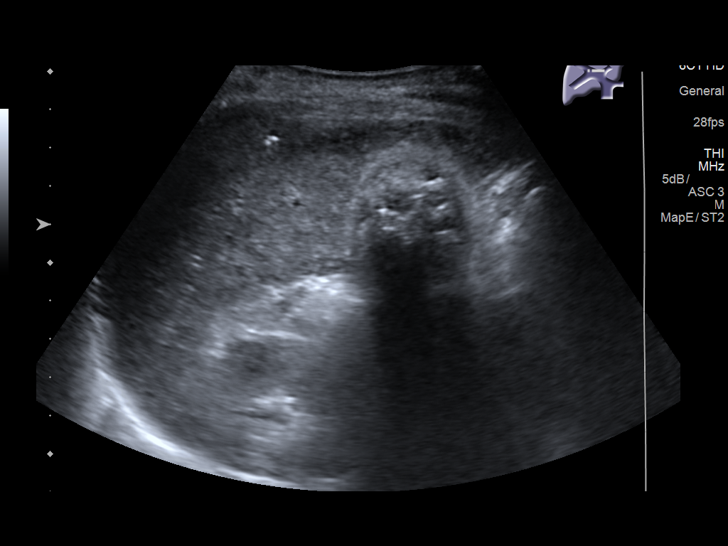
[im 37/41]
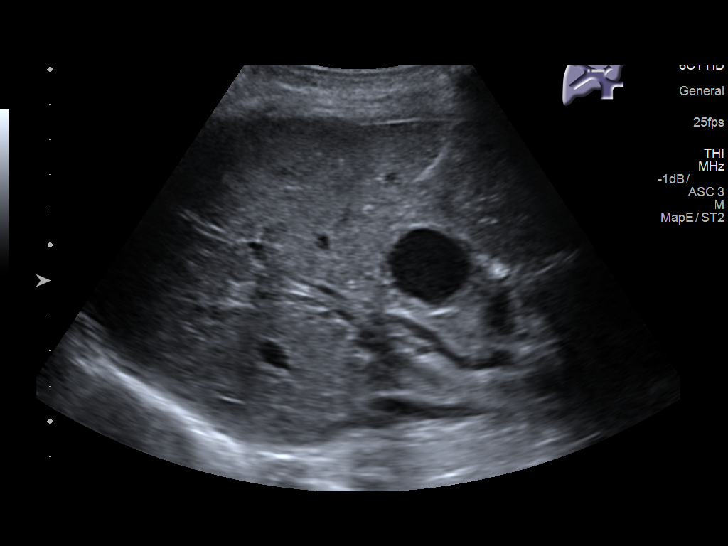
[im 41/41]
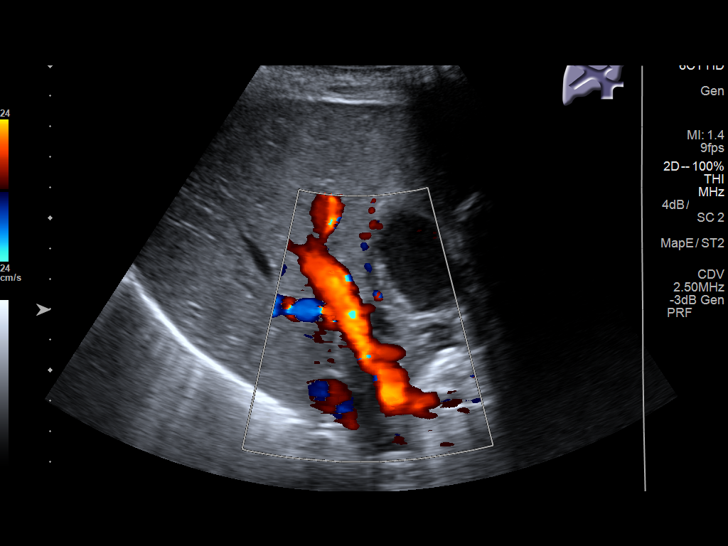

[14 of 25 positions shown; findings below may reference images not displayed]

FINDINGS: Gallbladder:

Multiple gallstones within lumen gallbladder. The gallbladder wall
is mildly thickened at 4 mm. No pericholecystic fluid. Negative
sonographic Murphy's sign.

Common bile duct:

Diameter: Normal at 5 mm

Liver:

No focal lesion identified. Within normal limits in parenchymal
echogenicity. Portal vein is patent on color Doppler imaging with
normal direction of blood flow towards the liver.
IMPRESSION: 1. Multiple gallstones fill the lumen of the gallbladder with mild
gallbladder wall thickening. No sonographic Murphy's sign. No
pericholecystic fluid.
2. Normal common bile duct .

## 2019-06-20 IMAGING — CT CT ABD-PELV W/ CM
2 of 5 series · 15 of 46 positions shown, 17 images · IV contrast (Omni 300)
Comparison: None.

CLINICAL DATA: Nausea and vomiting with an elevated lactic acid and
decreased hemoglobin. Elevated white blood cell count.

EXAM:
CT ABDOMEN AND PELVIS WITH CONTRAST
TECHNIQUE: Multidetector CT imaging of the abdomen and pelvis was performed
using the standard protocol following bolus administration of
intravenous contrast.
CONTRAST:  80 ml ZYRZCW-JYY IOPAMIDOL (ZYRZCW-JYY) INJECTION 61%

[Series 3: a/p w/ 5mm · axial · 0.74mm/px · z∈[+923,+1288]mm · 12 of 83 slices shown, 14 images]
[im 5/83  soft-tissue]
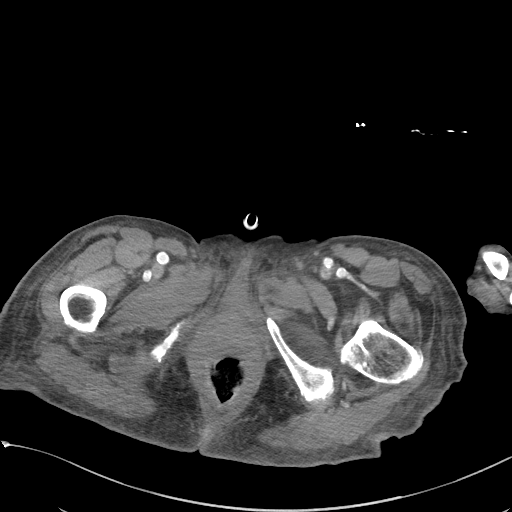
[im 5/83  bone]
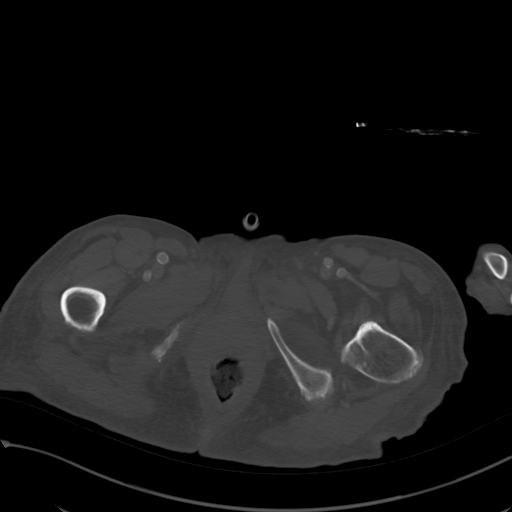
[im 13/83  soft-tissue]
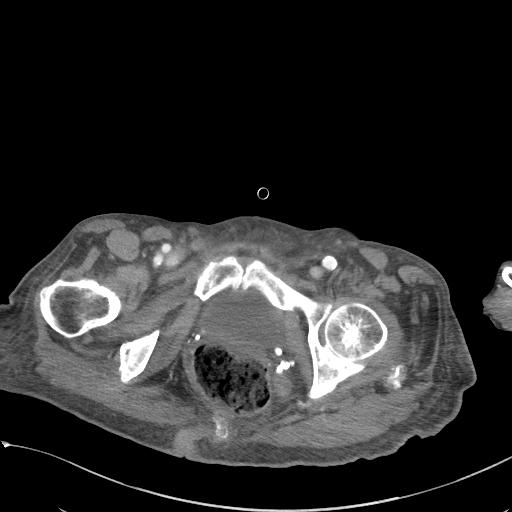
[im 18/83  soft-tissue]
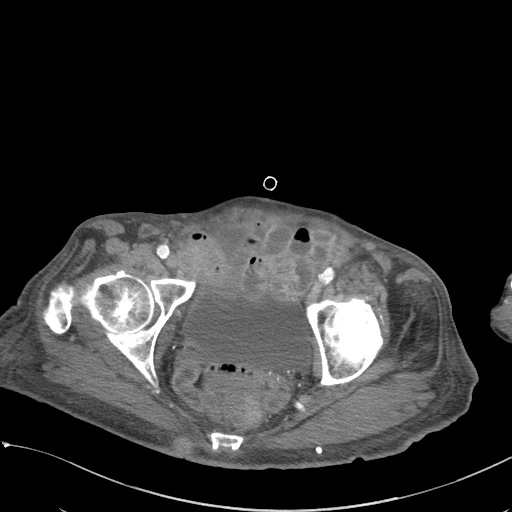
[im 26/83  soft-tissue]
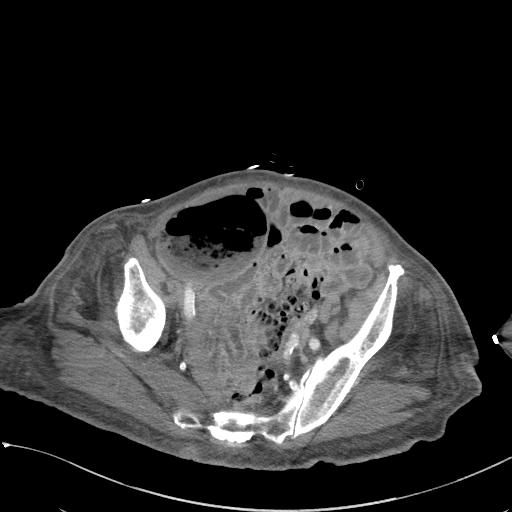
[im 31/83  soft-tissue]
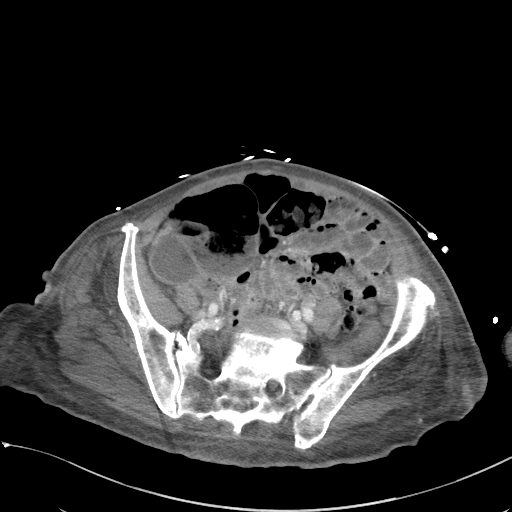
[im 39/83  soft-tissue]
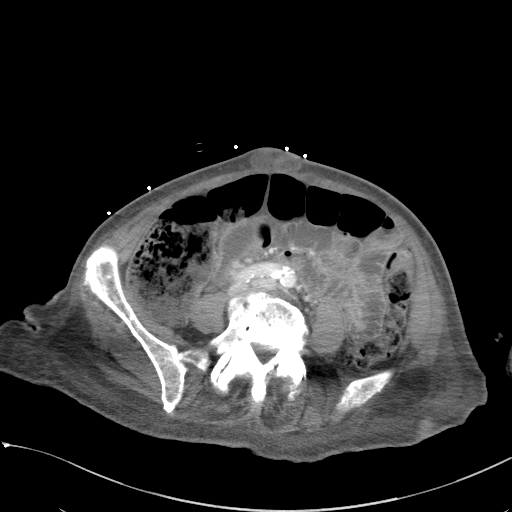
[im 44/83  soft-tissue]
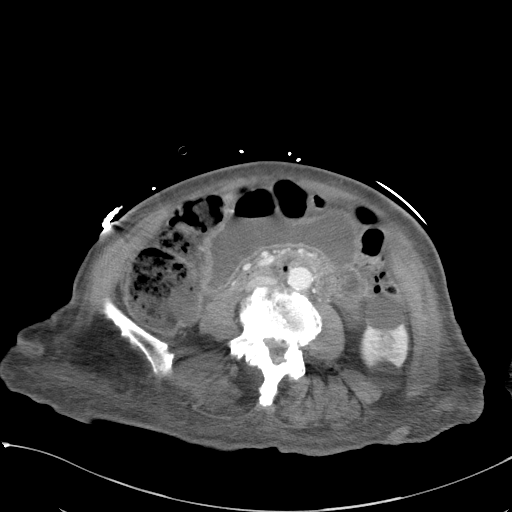
[im 52/83  soft-tissue]
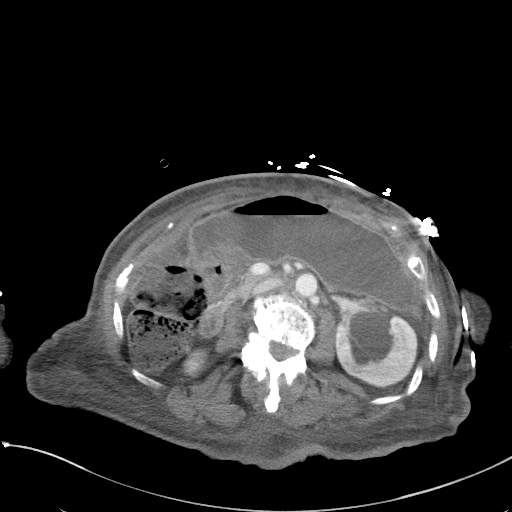
[im 57/83  soft-tissue]
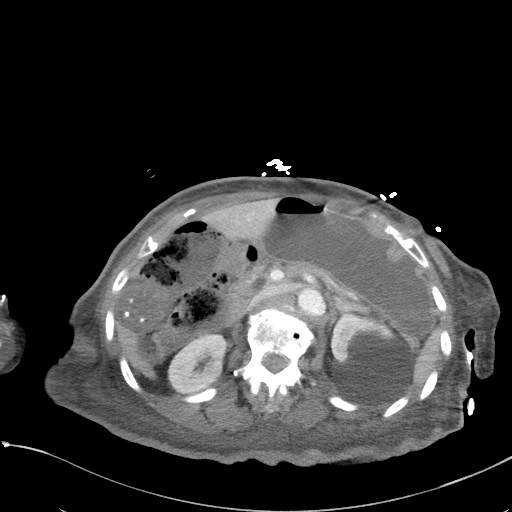
[im 57/83  bone]
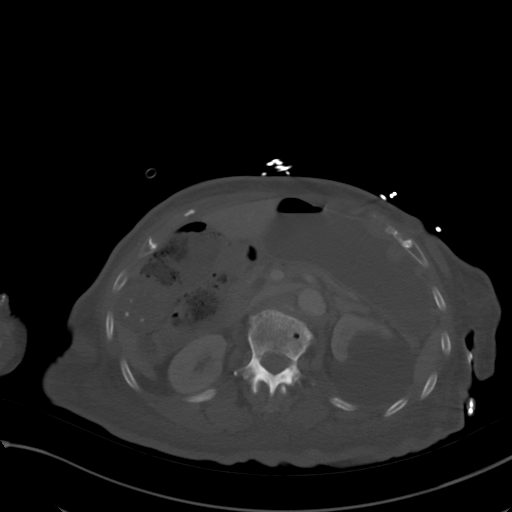
[im 65/83  soft-tissue]
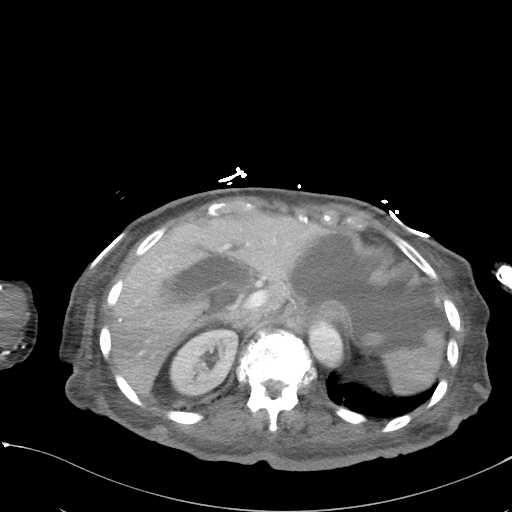
[im 70/83  soft-tissue]
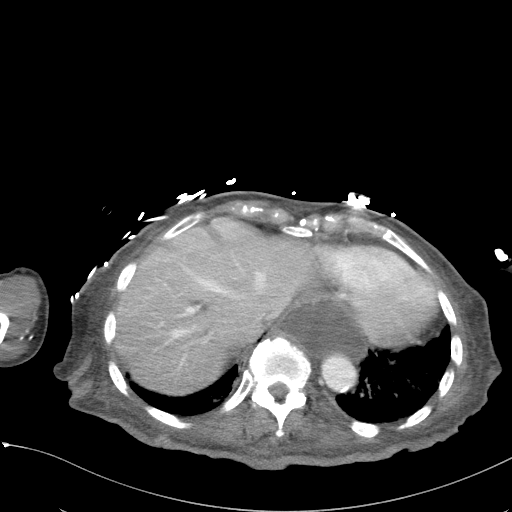
[im 78/83  soft-tissue]
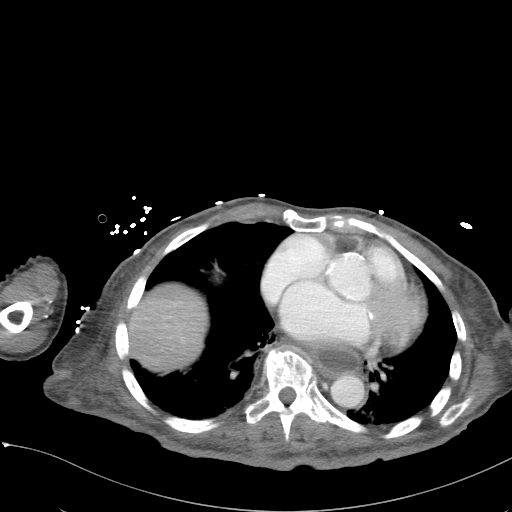

[Series 6: a/p w/ cor · coronal · 0.55mm/px · 3 of 148 slices shown]
[im 50/148  soft-tissue]
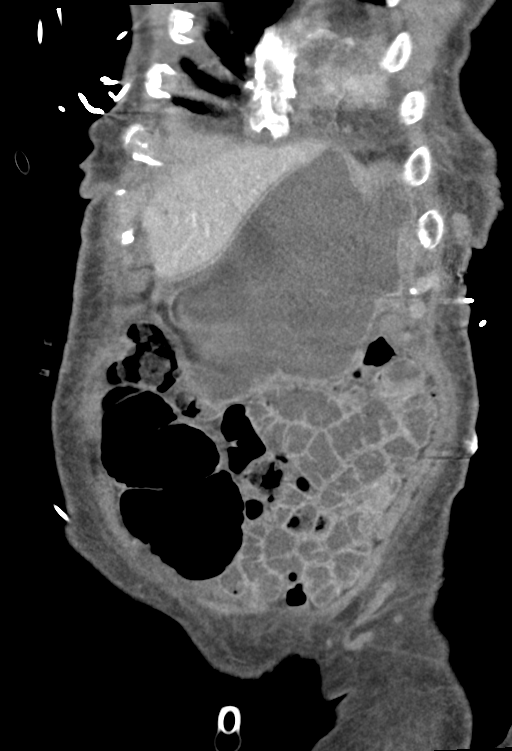
[im 66/148  soft-tissue]
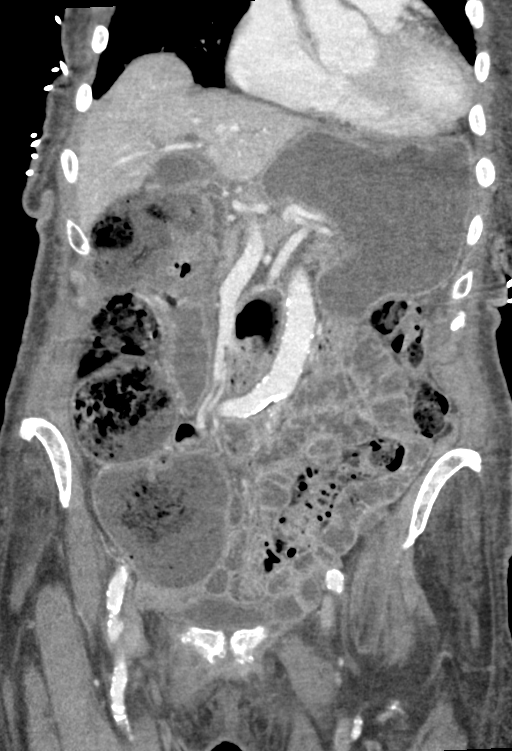
[im 82/148  soft-tissue]
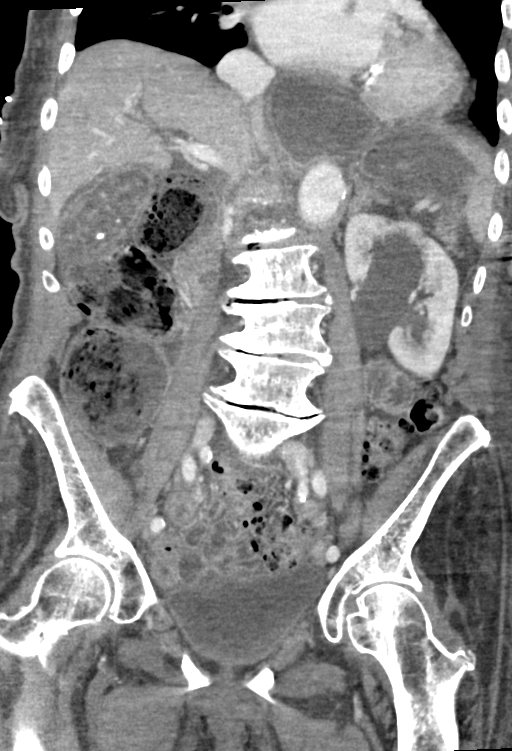

[15 of 46 positions shown; findings below may reference images not displayed]

FINDINGS: Lower chest: There is cardiomegaly. No pericardial pleural effusion.
Dependent atelectasis is seen in the lung bases. The distal
esophagus is dilated and fluid-filled.

Hepatobiliary: Multiple stones are identified in the gallbladder.
There is pericholecystic fluid. Mild intrahepatic biliary ductal
dilatation is seen. The common bile duct is also mildly dilated at
0.8 cm. No focal liver lesion is identified.

Pancreas: The pancreas is atrophic. No focal lesion or surrounding
inflammatory change.

Spleen: Normal in size.

Adrenals/Urinary Tract: The adrenal glands appear normal. The
patient has extensive cyst formation in the left kidney. A few small
low attenuating lesions in the right kidney are likely cysts.
Urinary bladder is unremarkable.

Stomach/Bowel: The stomach is fluid-filled. Hiatal hernia is noted.
No evidence of small-bowel obstruction is identified. Prominent gas
and stool in the ascending colon and proximal transverse colon are
seen. The patient has extensive sigmoid diverticulosis. The appendix
is not visualized but no pericecal inflammatory process is seen. No
pneumatosis, portal venous gas or free intraperitoneal air is seen.

Vascular/Lymphatic: Extensive aortoiliac atherosclerosis is seen. No
aneurysm or branch vessel occlusion identified.

Reproductive: Status post hysterectomy. No adnexal masses.

Other: No fluid collection.

Musculoskeletal: The patient has severe multilevel thoracic and
lumbar spondylosis. Convex left lumbar scoliosis is noted. No acute
abnormality or focal lesion.
IMPRESSION: No CT signs of bowel ischemia.  Negative for bowel obstruction.

Multiple gallstones with gallbladder wall thickening and/or
pericholecystic fluid. If there is concern for cholecystitis, right
upper quadrant ultrasound could be used for further evaluation.
There is mild intra and extrahepatic biliary ductal dilatation but
no common duct stone is identified.

Extensive sigmoid diverticulosis without evidence diverticulitis.

Fluid filled distal esophagus with a hiatal hernia could be due to
reflux and/or poor motility. Gallstones with pericholecystic fluid
and gallbladder wall thickening and mild dilatation of the common
bile duct.

Aortoiliac atherosclerosis without aneurysm.

Cardiomegaly.
